# Patient Record
Sex: Male | Born: 1935 | Race: White | Hispanic: No | Marital: Married
Health system: Western US, Academic
[De-identification: ages and names within clinical notes are randomized; demographics above are authoritative.]

## PROBLEM LIST (undated history)

## (undated) DIAGNOSIS — I639 Cerebral infarction, unspecified: Secondary | ICD-10-CM

## (undated) DIAGNOSIS — I712 Thoracic aortic aneurysm, without rupture: Secondary | ICD-10-CM

## (undated) DIAGNOSIS — C61 Malignant neoplasm of prostate: Secondary | ICD-10-CM

## (undated) DIAGNOSIS — I2584 Coronary atherosclerosis due to calcified coronary lesion: Secondary | ICD-10-CM

## (undated) DIAGNOSIS — R55 Syncope and collapse: Secondary | ICD-10-CM

## (undated) DIAGNOSIS — C4490 Unspecified malignant neoplasm of skin, unspecified: Secondary | ICD-10-CM

## (undated) DIAGNOSIS — E785 Hyperlipidemia, unspecified: Secondary | ICD-10-CM

## (undated) DIAGNOSIS — I2510 Atherosclerotic heart disease of native coronary artery without angina pectoris: Secondary | ICD-10-CM

## (undated) DIAGNOSIS — I10 Essential (primary) hypertension: Secondary | ICD-10-CM

## (undated) HISTORY — DX: Cerebral infarction, unspecified: I63.9

## (undated) HISTORY — DX: Malignant neoplasm of prostate: C61

## (undated) HISTORY — DX: Syncope and collapse: R55

## (undated) HISTORY — DX: Unspecified malignant neoplasm of skin, unspecified: C44.90

## (undated) HISTORY — DX: Atherosclerotic heart disease of native coronary artery without angina pectoris: I25.10

## (undated) HISTORY — DX: Hyperlipidemia, unspecified: E78.5

## (undated) HISTORY — DX: Coronary atherosclerosis due to calcified coronary lesion: I25.84

## (undated) HISTORY — DX: Thoracic aortic aneurysm, without rupture: I71.2

## (undated) HISTORY — DX: Essential (primary) hypertension: I10

---

## 1988-11-09 HISTORY — PX: KIDNEY STONE SURGERY: SHX686

## 1992-11-09 HISTORY — PX: MELANOMA EXCISION: SHX5266

## 2003-11-10 HISTORY — PX: HERNIA REPAIR: SHX51

## 2015-08-17 ENCOUNTER — Emergency Department (EMERGENCY_DEPARTMENT_HOSPITAL): Payer: Medicare Other

## 2015-08-17 ENCOUNTER — Inpatient Hospital Stay
Admission: EM | Admit: 2015-08-17 | Discharge: 2015-08-18 | DRG: 312 | Disposition: A | Discharge status: Home / Self Care | Payer: Medicare Other | Attending: Specialist | Admitting: Specialist

## 2015-08-17 DIAGNOSIS — I10 Essential (primary) hypertension: Secondary | ICD-10-CM | POA: Diagnosis present

## 2015-08-17 DIAGNOSIS — R079 Chest pain, unspecified: Secondary | ICD-10-CM

## 2015-08-17 DIAGNOSIS — N170 Acute kidney failure with tubular necrosis: Secondary | ICD-10-CM | POA: Diagnosis present

## 2015-08-17 DIAGNOSIS — R55 Syncope and collapse: Principal | ICD-10-CM | POA: Diagnosis present

## 2015-08-17 DIAGNOSIS — R011 Cardiac murmur, unspecified: Secondary | ICD-10-CM

## 2015-08-17 DIAGNOSIS — Z87891 Personal history of nicotine dependence: Secondary | ICD-10-CM | POA: Insufficient documentation

## 2015-08-17 DIAGNOSIS — Z8546 Personal history of malignant neoplasm of prostate: Secondary | ICD-10-CM | POA: Insufficient documentation

## 2015-08-17 DIAGNOSIS — Z923 Personal history of irradiation: Secondary | ICD-10-CM | POA: Insufficient documentation

## 2015-08-17 DIAGNOSIS — Z8673 Personal history of transient ischemic attack (TIA), and cerebral infarction without residual deficits: Secondary | ICD-10-CM | POA: Insufficient documentation

## 2015-08-17 LAB — CBC WITH DIFFERENTIAL
BASOPHILS % AUTO: 0.3 %
BASOPHILS ABS AUTO: 0 10*3/uL (ref 0–0.2)
EOSINOPHIL % AUTO: 1 %
EOSINOPHIL ABS AUTO: 0.1 10*3/uL (ref 0–0.5)
HEMATOCRIT: 37.9 % — AB (ref 41–53)
HEMOGLOBIN: 12.4 g/dL — AB (ref 13.5–17.5)
LYMPHOCYTE ABS AUTO: 1.4 10*3/uL (ref 1.0–4.8)
LYMPHOCYTES % AUTO: 19.7 %
MCH: 28.5 pg (ref 27–33)
MCHC: 32.6 % (ref 32–36)
MCV: 87.4 UM3 (ref 80–100)
MONOCYTES % AUTO: 6.3 %
MONOCYTES ABS AUTO: 0.4 10*3/uL (ref 0.1–0.8)
MPV: 7.3 UM3 (ref 6.8–10.0)
NEUTROPHIL ABS AUTO: 5.1 10*3/uL (ref 1.80–7.70)
NEUTROPHILS % AUTO: 72.7 %
PLATELET COUNT: 179 10*3/uL (ref 130–400)
RDW: 14.8 U — AB (ref 0–14.7)
RED CELL COUNT: 4.34 10*6/uL — AB (ref 4.5–5.9)
WHITE BLOOD CELL COUNT: 7 10*3/uL (ref 4.5–11.0)

## 2015-08-17 LAB — TROPONIN I

## 2015-08-17 LAB — BASIC METABOLIC PANEL
CALCIUM: 9 mg/dL (ref 8.6–10.5)
CARBON DIOXIDE TOTAL: 23 meq/L — AB (ref 24–32)
CHLORIDE: 103 meq/L (ref 95–110)
CREATININE BLOOD: 1.44 mg/dL — AB (ref 0.44–1.27)
GLUCOSE: 101 mg/dL — AB (ref 70–99)
POTASSIUM: 4.2 meq/L (ref 3.3–5.0)
SODIUM: 136 meq/L (ref 135–145)
UREA NITROGEN, BLOOD (BUN): 26 mg/dL — AB (ref 8–22)
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
## 2015-08-17 NOTE — ED Triage Note (Signed)
Felt dizziness with cold sweats and nausea prior to syncope. Hx cva, no facial droop, speech clear, cms intact, grips strong and equal. Denies pain

## 2015-08-18 ENCOUNTER — Inpatient Hospital Stay (EMERGENCY_DEPARTMENT_HOSPITAL): Payer: Medicare Other

## 2015-08-18 DIAGNOSIS — I77810 Thoracic aortic ectasia: Secondary | ICD-10-CM

## 2015-08-18 DIAGNOSIS — I700 Atherosclerosis of aorta: Secondary | ICD-10-CM

## 2015-08-18 DIAGNOSIS — R55 Syncope and collapse: Secondary | ICD-10-CM | POA: Insufficient documentation

## 2015-08-18 DIAGNOSIS — I2510 Atherosclerotic heart disease of native coronary artery without angina pectoris: Secondary | ICD-10-CM

## 2015-08-18 DIAGNOSIS — I10 Essential (primary) hypertension: Secondary | ICD-10-CM

## 2015-08-18 DIAGNOSIS — Z8673 Personal history of transient ischemic attack (TIA), and cerebral infarction without residual deficits: Secondary | ICD-10-CM

## 2015-08-18 DIAGNOSIS — E041 Nontoxic single thyroid nodule: Secondary | ICD-10-CM

## 2015-08-18 LAB — MAGNESIUM (MG): MAGNESIUM (MG): 2 mg/dL (ref 1.5–2.6)

## 2015-08-18 LAB — BASIC METABOLIC PANEL
CALCIUM: 9.1 mg/dL (ref 8.6–10.5)
CALCIUM: 9 mg/dL (ref 8.6–10.5)
CALCIUM: 8.8 mg/dL (ref 8.6–10.5)
CARBON DIOXIDE TOTAL: 25 meq/L (ref 24–32)
CARBON DIOXIDE TOTAL: 25 meq/L (ref 24–32)
CARBON DIOXIDE TOTAL: 24 meq/L (ref 24–32)
CHLORIDE: 107 meq/L (ref 95–110)
CHLORIDE: 105 meq/L (ref 95–110)
CHLORIDE: 104 meq/L (ref 95–110)
CREATININE BLOOD: 1.29 mg/dL — AB (ref 0.44–1.27)
CREATININE BLOOD: 1.16 mg/dL (ref 0.44–1.27)
CREATININE BLOOD: 1.03 mg/dL (ref 0.44–1.27)
GLUCOSE: 111 mg/dL — AB (ref 70–99)
GLUCOSE: 180 mg/dL — AB (ref 70–99)
GLUCOSE: 153 mg/dL — AB (ref 70–99)
POTASSIUM: 4 meq/L (ref 3.3–5.0)
POTASSIUM: 4.3 meq/L (ref 3.3–5.0)
POTASSIUM: 4.1 meq/L (ref 3.3–5.0)
SODIUM: 140 meq/L (ref 135–145)
SODIUM: 139 meq/L (ref 135–145)
SODIUM: 138 meq/L (ref 135–145)
UREA NITROGEN, BLOOD (BUN): 21 mg/dL (ref 8–22)
UREA NITROGEN, BLOOD (BUN): 20 mg/dL (ref 8–22)
UREA NITROGEN, BLOOD (BUN): 18 mg/dL (ref 8–22)

## 2015-08-18 LAB — CBC WITH DIFFERENTIAL
BASOPHILS % AUTO: 0.3 %
BASOPHILS ABS AUTO: 0 10*3/uL (ref 0–0.2)
EOSINOPHIL % AUTO: 1.5 %
EOSINOPHIL ABS AUTO: 0.1 10*3/uL (ref 0–0.5)
HEMATOCRIT: 38 % — AB (ref 41–53)
HEMOGLOBIN: 12.7 g/dL — AB (ref 13.5–17.5)
LYMPHOCYTE ABS AUTO: 1.4 10*3/uL (ref 1.0–4.8)
LYMPHOCYTES % AUTO: 23.3 %
MCH: 29 pg (ref 27–33)
MCHC: 33.5 % (ref 32–36)
MCV: 86.7 UM3 (ref 80–100)
MONOCYTES % AUTO: 7.6 %
MONOCYTES ABS AUTO: 0.5 10*3/uL (ref 0.1–0.8)
MPV: 7.2 UM3 (ref 6.8–10.0)
NEUTROPHIL ABS AUTO: 4.1 10*3/uL (ref 1.80–7.70)
NEUTROPHILS % AUTO: 67.3 %
PLATELET COUNT: 170 10*3/uL (ref 130–400)
RDW: 14.6 U (ref 0–14.7)
RED CELL COUNT: 4.38 10*6/uL — AB (ref 4.5–5.9)
WHITE BLOOD CELL COUNT: 6.1 10*3/uL (ref 4.5–11.0)

## 2015-08-18 LAB — D-DIMER: D-DIMER: 1670 ng/mL — AB (ref 0–230)

## 2015-08-18 MED ORDER — HEPARIN, PORCINE (PF) 5,000 UNIT/0.5 ML INJECTION SYRINGE
5000.0000 [IU] | INJECTION | Freq: Three times a day (TID) | INTRAMUSCULAR | Status: DC
Start: 2015-08-18 — End: 2015-08-18
  Administered 2015-08-18 (×2): 5000 [IU] via SUBCUTANEOUS
  Filled 2015-08-18 (×2): qty 1

## 2015-08-18 MED ORDER — NACL 0.9% IV BOLUS - DURATION REQ
1000.0000 mL | Freq: Once | INTRAVENOUS | Status: AC
Start: 2015-08-18 — End: 2015-08-18
  Administered 2015-08-18: 1000 mL via INTRAVENOUS

## 2015-08-18 MED ORDER — NACL 0.9% IV INFUSION
INTRAVENOUS | Status: DC
Start: 2015-08-18 — End: 2015-08-18
  Administered 2015-08-18: 10:00:00 via INTRAVENOUS

## 2015-08-18 MED ORDER — ASPIRIN 81 MG CHEWABLE TABLET
81.0000 mg | CHEWABLE_TABLET | Freq: Every day | ORAL | Status: DC
Start: 2015-08-18 — End: 2015-08-18
  Administered 2015-08-18: 81 mg via ORAL
  Filled 2015-08-18: qty 1

## 2015-08-18 NOTE — ED Nursing Note (Signed)
Report back to [employer] given

## 2015-08-18 NOTE — ED Nursing Note (Signed)
Assumed care for 1 hr from [facility] for lunch relief

## 2015-08-18 NOTE — ED Nursing Note (Signed)
Assumed care for 1 hour lunch relief.

## 2015-08-18 NOTE — ED Nursing Note (Signed)
MD aware of elevated d-dimer, no further orders. Patient remains without complaint, no chest pain, no shortness of breath, no leg pain or swelling.

## 2015-08-18 NOTE — ED Nursing Note (Signed)
Assumed care of patient. Resting comfortably in bed, no distress noted. Pt denies any chest pain or dizziness. Will continue to monitor.

## 2015-08-18 NOTE — ED Nursing Note (Signed)
Labs collected and sent. Pt started in IV fluids per order. Pt taken to get vascular ultrasound. Will continue to monitor.

## 2015-08-18 NOTE — Discharge Instructions (Signed)
Follow-up with your primary care provider within 1-2 weeks (as soon as you return to [facility]) for assessment of the finding of dilation of the ascending aorta as seen on CT scan performed during this admission.  Images and full reports can be obtained by your provider's office upon request.

## 2015-08-18 NOTE — ED Nursing Note (Signed)
Pt eating lunch. Resting comfortably.

## 2015-08-18 NOTE — ED Nursing Note (Signed)
Patient sleeping even unlabored resp

## 2015-08-18 NOTE — ED Nursing Note (Signed)
Reported off to Mark RN

## 2015-08-18 NOTE — Progress Notes (Addendum)
HOSPITAL MEDICINE ADMISSION FOLLOW-UP NOTE    Joel Love is a 81yr old male with a history of prostate cancer and prior CVA admitted for syncope evaluation2.  Patient admitted this morning, please see H&P by Dr. Trisha Mangle for full details of this admission.    Per history, the patient suffered a fainting episode while flying to [facility] from the [employer].  He denies any residual symptoms at this time and wants to be discharged by the end of the day.  ECHO is pending and d-dimer is elevated, which is nonspecific but opens the door for evaluation for PE as cause for syncope.  Patient willing to undergo a limited evaluation that could be done by this afternoon. Er radiology, questionable aortic dilation also noted on chest x-ray, can be followed by non-contrast chest CT.  Plan:     ECHO   LE dopplers in response to d-dimer   Non-contrast chest CT for aorta eval   Monitor on telemetry   Fluids, recheck BMP at 3 PM    If no acute findings on this limited evaluation (CT w/ contrast and overnight flouids / repeat renal eval in the morning discussed but declined by the patient) will likely proceed with d/c to home this afternoon    Addendum:  (10:48 AM) Notified by VL of superficial thrombosis in the LE but no DVT 9likely to explain the patient's d-dimer levels).     Addendum: (1:40 PM) CT chest prelim result reviewed.  4.2 cm dilation of the ascending aorta identified.  Will request CT surgery assessment and follow-up recommendations.      Addendum: (2:15 PM)Discussed with CT surgery team (who reviewed case with their attending).  Dilation above 4.5 cm is considered indication for elective surgery and no urgent intervention is required based on the patient's overall clinical presentation.  TTE to rule out severe aortic valve insufficiency (no murmur on my exam) is still recommended and will be done shortly.  If this is not seen on TTE, will proceed with discharge.     Addendum:  Overall plan of care discussed with the  patient, his wife and daughter at the bedside.  Patient does not want to remain for ECHO unless there is very high concern for aortic valve disorder, which is not supported by history (no prior symptoms previous to the single event on the the plane) and exam (no murmur).  Creatinine further improved to 1.03.  Patient discharged per his request, instructed to follow-up with his PMD immediately after discharge.      Report Electronically Signed by:   Arley Phenix. Webb Silversmith, MD/PhD  [facility] Attending  PI# [phone]
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
## 2015-08-18 NOTE — ED Nursing Note (Signed)
Pt discharged to home under care of family via steady ambulation to lobby. IV removed, tip intact. D/c instructions provided by MD. Pt verbalized understanding, denies further questions. Dr. Jerald Kief had conversation regarding ECHO with pt with pt ultimately deciding to forgo echo. VSS to baseline. Pt verifies all pt's belongings have been located and taken out of room. Pt states he is comfortable with plan for discharge and follow up and is eager to go home.

## 2015-08-18 NOTE — ED Nursing Note (Signed)
Patient without complaint and expressing a desire to go home this am.

## 2015-08-19 LAB — ELECTROCARDIOGRAM WITH RHYTHM STRIP: QTC: 436

## 2015-09-19 ENCOUNTER — Encounter: Payer: Self-pay | Admitting: Cardiovascular Disease

## 2015-09-19 ENCOUNTER — Ambulatory Visit (INDEPENDENT_AMBULATORY_CARE_PROVIDER_SITE_OTHER): Payer: Medicare Other | Admitting: Cardiovascular Disease

## 2015-09-19 VITALS — BP 162/100 | HR 76 | Ht 69.0 in | Wt 178.3 lb

## 2015-09-19 DIAGNOSIS — I639 Cerebral infarction, unspecified: Secondary | ICD-10-CM

## 2015-09-19 DIAGNOSIS — I63019 Cerebral infarction due to thrombosis of unspecified vertebral artery: Secondary | ICD-10-CM

## 2015-09-19 DIAGNOSIS — I2510 Atherosclerotic heart disease of native coronary artery without angina pectoris: Secondary | ICD-10-CM

## 2015-09-19 DIAGNOSIS — Z13220 Encounter for screening for lipoid disorders: Secondary | ICD-10-CM

## 2015-09-19 DIAGNOSIS — I712 Thoracic aortic aneurysm, without rupture, unspecified: Secondary | ICD-10-CM | POA: Insufficient documentation

## 2015-09-19 DIAGNOSIS — I2584 Coronary atherosclerosis due to calcified coronary lesion: Secondary | ICD-10-CM

## 2015-09-19 DIAGNOSIS — Z9889 Other specified postprocedural states: Secondary | ICD-10-CM

## 2015-09-19 DIAGNOSIS — R072 Precordial pain: Secondary | ICD-10-CM

## 2015-09-19 DIAGNOSIS — R55 Syncope and collapse: Secondary | ICD-10-CM

## 2015-09-19 DIAGNOSIS — Z8679 Personal history of other diseases of the circulatory system: Secondary | ICD-10-CM

## 2015-09-19 DIAGNOSIS — I10 Essential (primary) hypertension: Secondary | ICD-10-CM

## 2015-09-19 HISTORY — DX: Thoracic aortic aneurysm, without rupture: I71.2

## 2015-09-19 HISTORY — DX: Atherosclerotic heart disease of native coronary artery without angina pectoris: I25.10

## 2015-09-19 HISTORY — DX: Cerebral infarction, unspecified: I63.9

## 2015-09-19 HISTORY — DX: Essential (primary) hypertension: I10

## 2015-09-19 HISTORY — DX: Thoracic aortic aneurysm, without rupture, unspecified: I71.20

## 2015-09-19 HISTORY — DX: Syncope and collapse: R55

## 2015-09-19 MED ORDER — LOSARTAN POTASSIUM 50 MG PO TABS: 50.0000 mg | ORAL_TABLET | Freq: Every day | ORAL | Status: DC

## 2015-09-19 NOTE — Patient Instructions (Addendum)
Your physician wants you to follow-up in [place]. You will receive a reminder letter in the mail two months in advance. If you don't receive a letter, please call our office to schedule the follow-up appointment.    Your physician has requested that you have an echocardiogram [place]  300. Echocardiography is a painless test that uses sound waves to create images of your heart. It provides your doctor with information about the size and shape of your heart and how well your heart's chambers and valves are working. This procedure takes approximately one hour. There are no restrictions for this procedure.    Your physician has requested that you have en exercise stress myoview. For further information please visit [email or web address]. Please follow instruction sheet, as given.  START LOSARTAN 50 MG ONE TABLET BY MOUTH DAILY.  LABS- CMP,LIPID-- NOTHING TO EAT OR DRINK THE MORNING OF THE TEST  If you need a refill on your cardiac medications before your next appointment, please call your pharmacy.

## 2015-09-19 NOTE — Progress Notes (Signed)
Cardiology Office Note   Date:  09/19/2015   ID:  Chris Navarro, DOB 1935-03-20, MRN [id number]  PCP:  Marcelina Morel, MD  Cardiologist:   Sharol Harness, MD   Chief Complaint  Patient presents with  . New Evaluation    Holter Monitor 10/27-10/28//pt c/o slight lightheadedness when he bends down and stands up//pt states no other Sx.      History of Present Illness: Kyndal Wiggs is a 80 y.o. male with prior stroke who presents for management of a thoracic aortic aneurysm. Mr. next was on a transcontinental flight in October when he had an episode of syncope. He suddenly broke out into a sweat and then passed out. He denies any preceding chest pain, palpitations, or shortness of breath. The episode lasted for 1-2 minutes before he regained consciousness. He denies any loss of bowel or bladder function or seizure-like activity. He was seen at [facility] where his workup was reportedly unremarkable.. The event was thought to be due to intravascular volume depletion. He had a CT scan that was negative for pulmonary embolism, but did show some coronary calcifications. It also showed an ascending thoracic aortic aneurysm of 4.3 cm. He was told to follow-up with cardiologist.  Mr. Salama also had a Holter monitor placed that revealed mostly sinus rhythm with 34 minutes of bradycardia with heart rates in the upper 40s. In addition it revealed a 16 beat run of supraventricular tachycardia.  He denies any symptoms while wearing the monitor and thinks that he was up and getting dressed during the episode of bradycardia. He does not refer any lightheadedness or dizziness at that time.  Mr.Wolak denies any chest pain, shortness of breath, lower extremity edema, orthopnea, PND. He golfs 2-3 times per week and mostly rides a cart but does walk as well. He does stretching exercises 5 times per week and occasionally does yard work or goes for a walk with his wife. He reports that his diet is pretty good.  He does like to eat a lot of cheese. He quit smoking over 50 years ago.  He and his wife spend November through April in [place], and are preparing to leave soon.  Past Medical History  Diagnosis Date  . Stroke [facility]) mini stroke  . Skin cancer melanoma  . Prostate cancer ([place])   . Thoracic aortic aneurysm ([place]) 09/19/2015    Descending; 4.3 cm 08/2015 on CT scan  . Essential hypertension 09/19/2015  . Stroke (cerebrum) ([place]) 09/19/2015  . Syncope 09/19/2015  . Coronary artery calcification 09/19/2015    Past Surgical History  Procedure Laterality Date  . Kidney stone surgery  1990  . Hernia repair  2005  . Melanoma excision  1994     Current Outpatient Prescriptions  Medication Sig Dispense Refill  . aspirin 81 MG tablet Take 81 mg by mouth daily.    . Magnesium 400 MG CAPS Take 400 mg by mouth daily.    Marland Kitchen losartan (COZAAR) 50 MG tablet Take 1 tablet (50 mg total) by mouth daily. 90 tablet 3   No current facility-administered medications for this visit.    Allergies:   Review of patient's allergies indicates no known allergies.    Social History:  The patient  reports that he quit smoking about 50 years ago. He does not have any smokeless tobacco history on file. He reports that he drinks about 0.6 - 1.2 oz of alcohol per week.   Family History:  The patient's family  history includes Angina in his brother; Cancer in his sister; Diabetes in his brother; Stroke in his father.    ROS:  Please see the history of present illness.   Otherwise, review of systems are positive for none.   All other systems are reviewed and negative.    PHYSICAL EXAM: VS:  BP 162/100 mmHg  Pulse 76  Ht 5\' 9"  (1.753 m)  Wt 80.876 kg (178 lb 4.8 oz)  BMI 26.32 kg/m2 , BMI Body mass index is 26.32 kg/(m^2). GENERAL:  Well appearing HEENT:  Pupils equal round and reactive, fundi not visualized, oral mucosa unremarkable NECK:  No jugular venous distention, waveform within normal limits, carotid  upstroke brisk and symmetric, no bruits, no thyromegaly LYMPHATICS:  No cervical adenopathy LUNGS:  Clear to auscultation bilaterally HEART:  RRR.  PMI not displaced or sustained,S1 and S2 within normal limits, no S3, no S4, no clicks, no rubs, no murmurs ABD:  Flat, positive bowel sounds normal in frequency in pitch, no bruits, no rebound, no guarding, no midline pulsatile mass, no hepatomegaly, no splenomegaly EXT:  2 plus pulses throughout, no edema, no cyanosis no clubbing SKIN:  No rashes no nodules NEURO:  Cranial nerves II through XII grossly intact, motor grossly intact throughout PSYCH:  Cognitively intact, oriented to person place and time    EKG:  EKG is ordered today. The ekg ordered today demonstrates sinus rhythm rate 76 bpm.   Recent Labs: No results found for requested labs within last 365 days.    Lipid Panel No results found for: CHOL, TRIG, HDL, CHOLHDL, VLDL, LDLCALC, LDLDIRECT    Wt Readings from Last 3 Encounters:  09/19/15 80.876 kg (178 lb 4.8 oz)      ASSESSMENT AND PLAN:  # Coronary calcification: Given the finding of coronary calcification, we know that Mr. Bayron has some coronary artery disease. Therefore we should maximize his medications. We will obtain a fasting lipid panel and determine his statin dose based on those results. We will also obtain a comprehensive metabolic panel to assess his liver function prior to starting therapy. He is already taking an aspirin. Given that he had this episode of syncope, we will also refer him for exercise Myoview to evaluate for obstructive coronary artery disease.  # Syncope: It is unclear what caused Mr. Presto to have a syncopal event.  He did have documented bradycardia on his Holter. However he was a symptomatically at the time. Therefore we will not refer him for placement of a pacemaker. We will, however, avoid nodal agents. We are also evaluating for coronary artery disease as above.  # Hypertension: Blood  pressure poorly controlled. We discussed the fact that this will be important in managing his aortic aneurysm. We will start losartan 50 mg daily. We are obtaining a compressive metabolic panel to assess his renal action and electrolytes. Unfortunately, we will not be able to start a beta blocker due to bradycardia noted on his Holter monitor and a syncopal event.  # TAA: 4.3 cm. Repeat this in one year and spread out the interval for monitoring if he remains stable at that time. Hypertension management as above.  We will obtain an echo to ensure that he does not have a bicuspid aortic valve.  I spent 45 minutes with Mr. Windon and his wife.  Current medicines are reviewed at length with the patient today.  The patient does not have concerns regarding medicines.  The following changes have been made:  Start losartan  Labs/ tests ordered today include:   Orders Placed This Encounter  Procedures  . Lipid panel  . Comprehensive metabolic panel  . Myocardial Perfusion Imaging  . ECHOCARDIOGRAM COMPLETE     Disposition:   FU with Ethleen Lormand C. Oval Linsey, MD in 6 months.    Signed, Sharol Harness, MD  09/19/2015 5:26 PM    [place]

## 2015-09-20 ENCOUNTER — Ambulatory Visit (HOSPITAL_COMMUNITY)
Admission: RE | Admit: 2015-09-20 | Discharge: 2015-09-20 | Disposition: A | Discharge status: Home / Self Care | Payer: Medicare Other | Source: Ambulatory Visit | Attending: Cardiology | Admitting: Cardiology

## 2015-09-20 ENCOUNTER — Telehealth (HOSPITAL_COMMUNITY): Payer: Self-pay

## 2015-09-20 ENCOUNTER — Encounter: Payer: Self-pay | Admitting: Cardiovascular Disease

## 2015-09-20 DIAGNOSIS — Z13220 Encounter for screening for lipoid disorders: Secondary | ICD-10-CM | POA: Diagnosis not present

## 2015-09-20 DIAGNOSIS — I10 Essential (primary) hypertension: Secondary | ICD-10-CM | POA: Diagnosis not present

## 2015-09-20 DIAGNOSIS — I639 Cerebral infarction, unspecified: Secondary | ICD-10-CM | POA: Diagnosis not present

## 2015-09-20 DIAGNOSIS — I2510 Atherosclerotic heart disease of native coronary artery without angina pectoris: Secondary | ICD-10-CM

## 2015-09-20 DIAGNOSIS — R072 Precordial pain: Secondary | ICD-10-CM

## 2015-09-20 DIAGNOSIS — R42 Dizziness and giddiness: Secondary | ICD-10-CM | POA: Insufficient documentation

## 2015-09-20 DIAGNOSIS — Z8673 Personal history of transient ischemic attack (TIA), and cerebral infarction without residual deficits: Secondary | ICD-10-CM | POA: Insufficient documentation

## 2015-09-20 DIAGNOSIS — I2584 Coronary atherosclerosis due to calcified coronary lesion: Secondary | ICD-10-CM

## 2015-09-20 LAB — COMPREHENSIVE METABOLIC PANEL
ALBUMIN: 4 g/dL (ref 3.6–5.1)
ALK PHOS: 71 U/L (ref 40–115)
ALT: 10 U/L (ref 9–46)
AST: 14 U/L (ref 10–35)
BUN: 20 mg/dL (ref 7–25)
CO2: 27 mmol/L (ref 20–31)
CREATININE: 1.26 mg/dL — AB (ref 0.70–1.18)
Calcium: 9.3 mg/dL (ref 8.6–10.3)
Chloride: 111 mmol/L — ABNORMAL HIGH (ref 98–110)
Glucose, Bld: 85 mg/dL (ref 65–99)
Potassium: 5.2 mmol/L (ref 3.5–5.3)
SODIUM: 147 mmol/L — AB (ref 135–146)
TOTAL PROTEIN: 6.9 g/dL (ref 6.1–8.1)
Total Bilirubin: 0.5 mg/dL (ref 0.2–1.2)

## 2015-09-20 LAB — MYOCARDIAL PERFUSION IMAGING
CHL CUP NUCLEAR SDS: 0
CHL CUP NUCLEAR SRS: 2
CHL CUP RESTING HR STRESS: 60 {beats}/min
CHL CUP STRESS STAGE 1 HR: 82 {beats}/min
CHL CUP STRESS STAGE 2 SPEED: 1 mph
CHL CUP STRESS STAGE 3 GRADE: 0 %
CHL CUP STRESS STAGE 3 SPEED: 1 mph
CHL CUP STRESS STAGE 4 GRADE: 10 %
CHL CUP STRESS STAGE 4 HR: 120 {beats}/min
CHL CUP STRESS STAGE 4 SPEED: 1.7 mph
CHL CUP STRESS STAGE 5 DBP: 89 mmHg
CHL CUP STRESS STAGE 5 HR: 144 {beats}/min
CHL CUP STRESS STAGE 5 SBP: 161 mmHg
CHL CUP STRESS STAGE 5 SPEED: 2.5 mph
CHL CUP STRESS STAGE 6 GRADE: 12.1 %
CHL CUP STRESS STAGE 6 HR: 144 {beats}/min
CHL CUP STRESS STAGE 7 HR: 129 {beats}/min
CHL CUP STRESS STAGE 8 GRADE: 0 %
CHL CUP STRESS STAGE 8 HR: 75 {beats}/min
CHL CUP STRESS STAGE 8 SPEED: 0 mph
CSEPED: 6 min
CSEPEW: 7 METS
CSEPPHR: 144 {beats}/min
CSEPPMHR: 100 %
LV dias vol: 137 mL
LV sys vol: 72 mL
MPHR: 143 {beats}/min
Percent HR: 100 %
RPE: 16
SSS: 2
Stage 1 DBP: 95 mmHg
Stage 1 Grade: 0 %
Stage 1 SBP: 175 mmHg
Stage 1 Speed: 0 mph
Stage 2 Grade: 0 %
Stage 2 HR: 81 {beats}/min
Stage 3 HR: 81 {beats}/min
Stage 4 DBP: 96 mmHg
Stage 4 SBP: 134 mmHg
Stage 5 Grade: 12 %
Stage 6 Speed: 2.5 mph
Stage 7 DBP: 82 mmHg
Stage 7 Grade: 0 %
Stage 7 SBP: 196 mmHg
Stage 7 Speed: 0 mph
Stage 8 DBP: 102 mmHg
Stage 8 SBP: 193 mmHg
TID: 1.07

## 2015-09-20 LAB — LIPID PANEL
Cholesterol: 222 mg/dL — ABNORMAL HIGH (ref 125–200)
HDL: 53 mg/dL (ref >=40)
LDL CALC: 143 mg/dL — AB (ref <130)
TRIGLYCERIDES: 131 mg/dL (ref <150)
Total CHOL/HDL Ratio: 4.2 Ratio (ref <=5.0)
VLDL: 26 mg/dL (ref <30)

## 2015-09-20 MED ORDER — TECHNETIUM TC 99M SESTAMIBI GENERIC - CARDIOLITE
31.8000 | Freq: Once | INTRAVENOUS | Status: AC | PRN
  Administered 2015-09-20: 31.8 via INTRAVENOUS

## 2015-09-20 MED ORDER — TECHNETIUM TC 99M SESTAMIBI GENERIC - CARDIOLITE
10.7000 | Freq: Once | INTRAVENOUS | Status: AC | PRN
  Administered 2015-09-20: 10.7 via INTRAVENOUS

## 2015-09-20 NOTE — Telephone Encounter (Signed)
Encounter complete. 

## 2015-09-24 ENCOUNTER — Telehealth (HOSPITAL_COMMUNITY): Payer: Self-pay | Admitting: *Deleted

## 2015-09-24 ENCOUNTER — Encounter: Payer: Self-pay | Admitting: *Deleted

## 2015-09-24 ENCOUNTER — Telehealth: Payer: Self-pay | Admitting: *Deleted

## 2015-09-24 ENCOUNTER — Other Ambulatory Visit: Payer: Self-pay | Admitting: *Deleted

## 2015-09-24 NOTE — Telephone Encounter (Signed)
-----   Message from Skeet Latch, MD sent at 09/20/2015  5:53 PM EST ----- Normal coronary artery blood flow, but it looks like the heart is not squeezing normally.  We will wait for the results of his echo to learn more.

## 2015-09-24 NOTE — Telephone Encounter (Signed)
Spoke to patient. Result given . Verbalized understanding  

## 2015-09-25 ENCOUNTER — Telehealth: Payer: Self-pay | Admitting: Cardiovascular Disease

## 2015-09-25 DIAGNOSIS — E785 Hyperlipidemia, unspecified: Secondary | ICD-10-CM

## 2015-09-25 DIAGNOSIS — Z79899 Other long term (current) drug therapy: Secondary | ICD-10-CM

## 2015-09-25 MED ORDER — ATORVASTATIN CALCIUM 80 MG PO TABS: 80.0000 mg | ORAL_TABLET | Freq: Every day | ORAL | Status: DC

## 2015-09-25 NOTE — Telephone Encounter (Signed)
Spoke to patient 's wife. Results given. E-sent new prescription #90 x 3 atorvastatin 80 mg.  Mailed hepatic level -labslip To new address [place] going to [place]----[address]

## 2015-09-25 NOTE — Telephone Encounter (Signed)
-----   Message from Skeet Latch, MD sent at 09/25/2015  9:10 AM EST ----- Cholesterol levels are high and his risk of heart attack and stroke is high.  Start atorvastatin 80 mg daily to reduce that risk.  Follow up lfts in 1 month.  This can be done in [place].

## 2015-09-25 NOTE — Telephone Encounter (Signed)
Pt is calling in wanting to the know the results to the blood test he had done and he also inquired if an additional medication was going to be called in for him. Please f/u with him  Thanks

## 2015-10-02 NOTE — Addendum Note (Signed)
Addended by: Janett Labella A on: 10/02/2015 08:30 AM   Modules accepted: Orders

## 2015-10-07 ENCOUNTER — Telehealth: Payer: Self-pay | Admitting: Cardiovascular Disease

## 2015-10-07 NOTE — Telephone Encounter (Signed)
Echo order sent to provided fax. Informed caller via VM.

## 2015-10-07 NOTE — Telephone Encounter (Signed)
Pt's wife called in stating the hospital in [place] will need the orders for the Echo faxed to their facility. The name of the facility is [facility] in [place] and the fax number is [phone]. Please f/u with the wife once this has been sent   Thanks

## 2015-10-09 ENCOUNTER — Telehealth: Payer: Self-pay | Admitting: Cardiovascular Disease

## 2015-10-09 NOTE — Telephone Encounter (Signed)
She said she gave you an incorrect fax number the other day.The correct fax number is [phone].Would you please refax it.

## 2015-10-09 NOTE — Telephone Encounter (Signed)
Gave corrected fax number to  [facility]  In [place]  [phone] She has written order already to send Please let patient's wife know of completion

## 2015-10-29 ENCOUNTER — Telehealth: Payer: Self-pay | Admitting: Cardiovascular Disease

## 2015-10-29 NOTE — Telephone Encounter (Signed)
Echo report received.  10/23/15: LVEF 55-60%.  Mild LVH.  RV dilated with PASP 30 mmHg.  Mild MR, mild TR.  Aortic root 3.7 cm.  Aortic valve is normal.

## 2015-10-30 ENCOUNTER — Telehealth: Payer: Self-pay | Admitting: *Deleted

## 2015-10-30 NOTE — Telephone Encounter (Signed)
-----   Message from Skeet Latch, MD sent at 10/29/2015  4:52 PM EST ----- Echo shows normal aortic valve.  Mildly dilated aorta.  His heart does not relax completely.  It will be important keep his blood pressure well-controlled.  Keep follow up as scheduled.  Thank you for sending report.

## 2015-10-30 NOTE — Telephone Encounter (Signed)
LEFT MESSAGE TO CALL BACK CONCERNING LABS AND ECHO  LABS ( 10/26/15- hepatic panel ) REVIEWED -BY Dr- no change in current treatment  Oval Linsey

## 2015-10-30 NOTE — Telephone Encounter (Signed)
Spoke to [place] , okay to use ibuprofen - take with food, do not take everyday. Spoke to patient and wife. Information given. Verbalized understanding.

## 2015-10-30 NOTE — Telephone Encounter (Signed)
Spoke to patient. Result given . Verbalized understanding Patient wanted to know if he could use ibuprofen or anything else  RN will refer to pharmacist and contact patient

## 2015-11-22 ENCOUNTER — Encounter: Payer: Self-pay | Admitting: Cardiovascular Disease

## 2016-02-10 NOTE — Progress Notes (Signed)
Cardiology Office Note   Date:  02/11/2016   ID:  Chris Navarro, DOB 09-02-1935, MRN [id number]  PCP:  Chris Boone, MD  Cardiologist:   Chris Harness, MD   Chief Complaint  Patient presents with  . Hypertension    denies any chest pain, sob, le edema, or claudication      Patient ID: Chris Navarro is a 81 y.o. male with prior stroke, thoracic aortic aneurysm, and moderate aortic regurgitation who presents for management of a thoracic aortic aneurysm.  Interval History 02/11/16: After his last appointment Chris Navarro had an exercise Cardiolite that revealed an LVEF of 48% but there were no perfusion deficits.  He subsequently had an echo done in [place] with LVEF 55-60%.  There was moderate LA enlargement, mild mitral regurgitation, and moderate aortic regurgitation.   He has been doing fairly well.  He notes that his stamina is poor.  He gets tired after working in the yard for 15-30 minutes.  He notes general fatigue but denies any chest pain or shortness of breath.  His weight has been stable. He denies lower extremity edema, orthopnea, or PND. He also denies melena or hematochezia.  He occasionally notes lightheadedness when changing positions but denies any falls or syncope.  He brings a blood pressure log that shows his blood pressures have all been between [id number] to [id number] systolic.  Chris Navarro and his wife are back in [place] until November.  History of Present Illness 09/19/15:  Chris Navarro was on a transcontinental flight in October when he had an episode of syncope. He suddenly broke out into a sweat and then passed out. He denies any preceding chest pain, palpitations, or shortness of breath. The episode lasted for 1-2 minutes before he regained consciousness. He denies any loss of bowel or bladder function or seizure-like activity. He was seen at [facility] where his workup was reportedly unremarkable.. The event was thought to be due to intravascular volume depletion. He  had a CT scan that was negative for pulmonary embolism, but did show some coronary calcifications. It also showed an ascending thoracic aortic aneurysm of 4.3 cm. He was told to follow-up with cardiologist.  Chris Navarro also had a Holter monitor placed that revealed mostly sinus rhythm with 34 minutes of bradycardia with heart rates in the upper 40s. In addition it revealed a 16 beat run of supraventricular tachycardia.  He denies any symptoms while wearing the monitor and thinks that he was up and getting dressed during the episode of bradycardia. He does not refer any lightheadedness or dizziness at that time.  Chris Navarro denies any chest pain, shortness of breath, lower extremity edema, orthopnea, PND. He golfs 2-3 times per week and mostly rides a cart but does walk as well. He does stretching exercises 5 times per week and occasionally does yard work or goes for a walk with his wife. He reports that his diet is pretty good. He does like to eat a lot of cheese. He quit smoking over 50 years ago.  He and his wife spend November through April in [place], and are preparing to leave soon.  Past Medical History  Diagnosis Date  . Stroke [facility]) mini stroke  . Skin cancer melanoma  . Prostate cancer ([place])   . Thoracic aortic aneurysm ([place]) 09/19/2015    Descending; 4.3 cm 08/2015 on CT scan  . Essential hypertension 09/19/2015  . Stroke (cerebrum) ([place]) 09/19/2015  . Syncope 09/19/2015  . Coronary artery calcification  09/19/2015  . Hyperlipidemia 02/11/2016    Past Surgical History  Procedure Laterality Date  . Kidney stone surgery  1990  . Hernia repair  2005  . Melanoma excision  1994     Current Outpatient Prescriptions  Medication Sig Dispense Refill  . aspirin 81 MG tablet Take 81 mg by mouth daily.    Marland Kitchen atorvastatin (LIPITOR) 80 MG tablet Take 1 tablet (80 mg total) by mouth daily. 90 tablet 3  . losartan (COZAAR) 50 MG tablet Take 1 tablet (50 mg total) by mouth daily. 90 tablet 3  .  Magnesium 500 MG TABS Take 500 mg by mouth daily.     No current facility-administered medications for this visit.    Allergies:   Review of patient's allergies indicates no known allergies.    Social History:  The patient  reports that he quit smoking about 50 years ago. He does not have any smokeless tobacco history on file. He reports that he drinks about 0.6 - 1.2 oz of alcohol per week.   Family History:  The patient's family history includes Angina in his brother; Cancer in his sister; Diabetes in his brother; Stroke in his father.    ROS:  Please see the history of present illness.   Otherwise, review of systems are positive for numbness in the right leg and foot..   All other systems are reviewed and negative.    PHYSICAL EXAM: VS:  BP 130/74 mmHg  Pulse 64  Ht 5\' 9"  (1.753 m)  Wt 79.742 kg (175 lb 12.8 oz)  BMI 25.95 kg/m2 , BMI Body mass index is 25.95 kg/(m^2). GENERAL:  Well appearing HEENT:  Pupils equal round and reactive, fundi not visualized, oral mucosa unremarkable NECK:  No jugular venous distention, waveform within normal limits, carotid upstroke brisk and symmetric, no bruits LYMPHATICS:  No cervical adenopathy LUNGS:  Clear to auscultation bilaterally HEART:  RRR.  PMI not displaced or sustained,S1 and S2 within normal limits, no S3, no S4, no clicks, no rubs, no murmurs ABD:  Flat, positive bowel sounds normal in frequency in pitch, no bruits, no rebound, no guarding, no midline pulsatile mass, no hepatomegaly, no splenomegaly EXT:  2 plus pulses throughout, no edema, no cyanosis no clubbing SKIN:  No rashes no nodules NEURO:  Cranial nerves II through XII grossly intact, motor grossly intact throughout PSYCH:  Cognitively intact, oriented to person place and time  EKG:  EKG is ordered today. The ekg ordered today demonstrates sinus rhythm rate 76 bpm.  Recent Labs: 09/19/2015: ALT 10; BUN 20; Creat 1.26*; Potassium 5.2; Sodium 147*    Lipid Panel      Component Value Date/Time   CHOL 222* 09/19/2015 0841   TRIG 131 09/19/2015 0841   HDL 53 09/19/2015 0841   CHOLHDL 4.2 09/19/2015 0841   VLDL 26 09/19/2015 0841   LDLCALC 143* 09/19/2015 0841      Wt Readings from Last 3 Encounters:  02/11/16 79.742 kg (175 lb 12.8 oz)  09/20/15 80.74 kg (178 lb)  09/19/15 80.876 kg (178 lb 4.8 oz)      ASSESSMENT AND PLAN:   # Aortic regurgitation: Asymtomatic.  He is euvolemic on exam.  He had normal systolic function and no left ventricular dilation on echo. We will repeat his echo in 1 year.  # Asymptomatic coronary calcification: No perfusion deficits were noted on stress testing. Continue aspirin and atorvastatin 80 mg daily.  # Hypertension: Blood pressure is well-controlled on losartan.  #  Hyperlipidemia: Continue atorvastatin. Repeat lipids and LFTs in 1 year.  # TAA: 4.3 cm. Repeat this in one year and spread out the interval for monitoring if he remains stable at that time. Hypertension management as above.    # Fatigue: Chris Navarro notes fatigue with minimal exertion. He has no other associated symptoms. We will check a TSH and free T4 to assess his thyroid function. He is not anemic based on labs recently drawn.  We discussed his risks for sleep apnea and although he does endorse daytime somnolence, he feels well rested when awakening in the morning, does not snore heavily, or stop breathing while sleeping. Therefore we will not obtain a sleep study at this time.  Current medicines are reviewed at length with the patient today.  The patient does not have concerns regarding medicines.  The following changes have been made:   Labs/ tests ordered today include:   Orders Placed This Encounter  Procedures  . T4, free  . TSH  . ECHOCARDIOGRAM COMPLETE     Disposition:   FU with Jujhar Navarro C. Oval Linsey, MD in 1 year.    Signed, Chris Harness, MD  02/11/2016 1:41 PM    [place]

## 2016-02-11 ENCOUNTER — Encounter: Payer: Self-pay | Admitting: Cardiovascular Disease

## 2016-02-11 ENCOUNTER — Ambulatory Visit (INDEPENDENT_AMBULATORY_CARE_PROVIDER_SITE_OTHER): Payer: Medicare Other | Admitting: Cardiovascular Disease

## 2016-02-11 VITALS — BP 130/74 | HR 64 | Ht 69.0 in | Wt 175.8 lb

## 2016-02-11 DIAGNOSIS — I712 Thoracic aortic aneurysm, without rupture: Secondary | ICD-10-CM

## 2016-02-11 DIAGNOSIS — I10 Essential (primary) hypertension: Secondary | ICD-10-CM

## 2016-02-11 DIAGNOSIS — I351 Nonrheumatic aortic (valve) insufficiency: Secondary | ICD-10-CM | POA: Diagnosis not present

## 2016-02-11 DIAGNOSIS — E785 Hyperlipidemia, unspecified: Secondary | ICD-10-CM | POA: Diagnosis not present

## 2016-02-11 DIAGNOSIS — R5383 Other fatigue: Secondary | ICD-10-CM

## 2016-02-11 DIAGNOSIS — I7121 Aneurysm of the ascending aorta, without rupture: Secondary | ICD-10-CM

## 2016-02-11 HISTORY — DX: Hyperlipidemia, unspecified: E78.5

## 2016-02-11 LAB — TSH: TSH: 3.09 mIU/L (ref 0.40–4.50)

## 2016-02-11 LAB — T4, FREE: FREE T4: 1.3 ng/dL (ref 0.8–1.8)

## 2016-02-11 NOTE — Patient Instructions (Addendum)
Medication Instructions:  Your physician recommends that you continue on your current medications as directed. Please refer to the Current Medication list given to you today.   Labwork: Your physician recommends THAT YOU GET LABWORK AS SOON AS POSSIBLE. The lab can be found on the FIRST FLOOR of out building in [address]    Testing/Procedures: DR Hazard WILL WANT YOU TO HAVE A ECHO IN 1 YEAR TO BE SCHEDULED PRIOR TO YOUR 1 YEAR FOLLOW UP.  Your physician has requested that you have an echocardiogram IN 1 YEAR. Echocardiography is a painless test that uses sound waves to create images of your heart. It provides your doctor with information about the size and shape of your heart and how well your heart's chambers and valves are working. This procedure takes approximately one hour. There are no restrictions for this procedure.    Follow-Up: Your physician wants you to follow-up in: [place]. You will receive a reminder letter in the mail two months in advance. If you don't receive a letter, please call our office to schedule the follow-up appointment.   Any Other Special Instructions Will Be Listed Below (If Applicable).     If you need a refill on your cardiac medications before your next appointment, please call your pharmacy.

## 2016-06-11 ENCOUNTER — Telehealth: Payer: Self-pay | Admitting: Cardiovascular Disease

## 2016-06-11 MED ORDER — LOSARTAN POTASSIUM 50 MG PO TABS: 50.0000 mg | ORAL_TABLET | Freq: Every day | ORAL | 2 refills | Status: DC

## 2016-06-11 NOTE — Telephone Encounter (Signed)
New message     *STAT* If patient is at the pharmacy, call can be transferred to refill team.   1. Which medications need to be refilled? (please list name of each medication and dose if known) Losartan Potassium 50 mg po daily  2. Which pharmacy/location (including street and city if local pharmacy) is medication to be sent to?[facility] in [place]  3. Do they need a 30 day or 90 day supply? 90 days

## 2016-06-11 NOTE — Telephone Encounter (Signed)
E req sent.

## 2016-09-03 ENCOUNTER — Telehealth: Payer: Self-pay | Admitting: Cardiovascular Disease

## 2016-09-03 ENCOUNTER — Other Ambulatory Visit: Payer: Self-pay | Admitting: *Deleted

## 2016-09-03 DIAGNOSIS — Z79899 Other long term (current) drug therapy: Secondary | ICD-10-CM

## 2016-09-03 DIAGNOSIS — E785 Hyperlipidemia, unspecified: Secondary | ICD-10-CM

## 2016-09-03 MED ORDER — ATORVASTATIN CALCIUM 80 MG PO TABS: 80.0000 mg | ORAL_TABLET | Freq: Every day | ORAL | 1 refills | Status: DC

## 2016-09-03 NOTE — Telephone Encounter (Signed)
New message        *STAT* If patient is at the pharmacy, call can be transferred to refill team.   1. Which medications need to be refilled? (please list name of each medication and dose if known) atorvastatin 80mg  2. Which pharmacy/location (including street and city if local pharmacy) is medication to be sent to?[facility] [place]  3. Do they need a 30 day or 90 day supply? 90 day supply and pt request 1 refill.  He will be going to [employer]

## 2017-02-23 ENCOUNTER — Other Ambulatory Visit (HOSPITAL_COMMUNITY): Payer: Medicare Other

## 2017-02-26 ENCOUNTER — Ambulatory Visit (HOSPITAL_COMMUNITY): Payer: Medicare Other | Attending: Cardiology

## 2017-02-26 ENCOUNTER — Ambulatory Visit (INDEPENDENT_AMBULATORY_CARE_PROVIDER_SITE_OTHER): Payer: Medicare Other | Admitting: Cardiovascular Disease

## 2017-02-26 ENCOUNTER — Other Ambulatory Visit: Payer: Self-pay

## 2017-02-26 VITALS — BP 106/74 | HR 62 | Ht 69.0 in | Wt 177.0 lb

## 2017-02-26 DIAGNOSIS — I351 Nonrheumatic aortic (valve) insufficiency: Secondary | ICD-10-CM | POA: Diagnosis present

## 2017-02-26 DIAGNOSIS — R252 Cramp and spasm: Secondary | ICD-10-CM

## 2017-02-26 DIAGNOSIS — I119 Hypertensive heart disease without heart failure: Secondary | ICD-10-CM | POA: Diagnosis not present

## 2017-02-26 DIAGNOSIS — Z79899 Other long term (current) drug therapy: Secondary | ICD-10-CM

## 2017-02-26 DIAGNOSIS — I340 Nonrheumatic mitral (valve) insufficiency: Secondary | ICD-10-CM | POA: Insufficient documentation

## 2017-02-26 DIAGNOSIS — I77810 Thoracic aortic ectasia: Secondary | ICD-10-CM | POA: Insufficient documentation

## 2017-02-26 DIAGNOSIS — E785 Hyperlipidemia, unspecified: Secondary | ICD-10-CM

## 2017-02-26 DIAGNOSIS — I2510 Atherosclerotic heart disease of native coronary artery without angina pectoris: Secondary | ICD-10-CM | POA: Diagnosis not present

## 2017-02-26 DIAGNOSIS — I10 Essential (primary) hypertension: Secondary | ICD-10-CM

## 2017-02-26 DIAGNOSIS — I2584 Coronary atherosclerosis due to calcified coronary lesion: Secondary | ICD-10-CM

## 2017-02-26 DIAGNOSIS — R5383 Other fatigue: Secondary | ICD-10-CM

## 2017-02-26 LAB — COMPREHENSIVE METABOLIC PANEL
ALK PHOS: 144 U/L — AB (ref 40–115)
ALT: 138 U/L — ABNORMAL HIGH (ref 9–46)
AST: 115 U/L — ABNORMAL HIGH (ref 10–35)
Albumin: 4 g/dL (ref 3.6–5.1)
BILIRUBIN TOTAL: 0.6 mg/dL (ref 0.2–1.2)
BUN: 24 mg/dL (ref 7–25)
CALCIUM: 9.6 mg/dL (ref 8.6–10.3)
CO2: 26 mmol/L (ref 20–31)
Chloride: 104 mmol/L (ref 98–110)
Creat: 1.37 mg/dL — ABNORMAL HIGH (ref 0.70–1.18)
Glucose, Bld: 90 mg/dL (ref 65–99)
POTASSIUM: 4.8 mmol/L (ref 3.5–5.3)
SODIUM: 139 mmol/L (ref 135–146)
TOTAL PROTEIN: 7.5 g/dL (ref 6.1–8.1)

## 2017-02-26 LAB — CBC WITH DIFFERENTIAL/PLATELET
BASOS PCT: 0 %
Basophils Absolute: 0 cells/uL (ref 0–200)
EOS ABS: 129 {cells}/uL (ref 15–500)
EOS PCT: 3 %
HEMATOCRIT: 38.2 % — AB (ref 38.5–50.0)
HEMOGLOBIN: 12.8 g/dL — AB (ref 13.2–17.1)
LYMPHS PCT: 22 %
Lymphs Abs: 946 cells/uL (ref 850–3900)
MCH: 29.5 pg (ref 27.0–33.0)
MCHC: 33.5 g/dL (ref 32.0–36.0)
MCV: 88 fL (ref 80.0–100.0)
MONOS PCT: 9 %
MPV: 9.2 fL (ref 7.5–12.5)
Monocytes Absolute: 387 cells/uL (ref 200–950)
NEUTROS ABS: 2838 {cells}/uL (ref 1500–7800)
Neutrophils Relative %: 66 %
Platelets: 149 10*3/uL (ref 140–400)
RBC: 4.34 MIL/uL (ref 4.20–5.80)
RDW: 14.4 % (ref 11.0–15.0)
WBC: 4.3 10*3/uL (ref 3.8–10.8)

## 2017-02-26 LAB — LIPID PANEL
CHOL/HDL RATIO: 2.3 ratio (ref <5.0)
CHOLESTEROL: 124 mg/dL (ref <200)
HDL: 54 mg/dL (ref >40)
LDL CALC: 57 mg/dL (ref <100)
Triglycerides: 64 mg/dL (ref <150)
VLDL: 13 mg/dL (ref <30)

## 2017-02-26 NOTE — Progress Notes (Signed)
Cardiology Office Note   Date:  02/26/2017   ID:  Chris Navarro, DOB 08/05/1935, MRN [id number]  PCP:  Elita Boone, MD  Cardiologist:   Skeet Latch, MD   Chief Complaint  Patient presents with  . Follow-up    follow up ECHO today, no chest pain, has occassional shortness of breath, cramping and lightheadedness      Patient ID: Chris Navarro is a 82 y.o. male with prior stroke, thoracic aortic aneurysm, and moderate aortic regurgitation who presents for follow up.  Chris Navarro was first seen in 09/2015.  He had an episode of syncope while on a transcontinental flight 08/2015.   He was seen at [facility] where his workup was reportedly unremarkable.. The event was thought to be due to intravascular volume depletion. He had a CT scan that was negative for pulmonary embolism, but did show some coronary calcifications. It also showed an ascending thoracic aortic aneurysm of 4.3 cm. He was told to follow-up with cardiologist.  Chris Navarro also had a Holter monitor placed that revealed mostly sinus rhythm with 34 minutes of bradycardia with heart rates in the upper 40s. In addition it revealed a 16 beat run of supraventricular tachycardia.  He denies any symptoms while wearing the monitor and thinks that he was up and getting dressed during the episode of bradycardia.   Chris Navarro was referred for an exercise Cardiolite 09/2015.  He exercised for 6 minutes and there were no perfusion defects. LVEF was 48%.  He subsequently had an echo done in [place] with LVEF 55-60%.  There was moderate LA enlargement, mild mitral regurgitation, and moderate aortic regurgitation.  He was noted to have a mild ascending aortic aneurysm measuring 4.3 cm.  Chris Navarro had a repeat echo 02/26/17 That revealed LVEF 60-65% with mild to moderate aortic regurgitation and a mild ascending aortic aneurysm that was unchanged in size. He has been feeling well overall. He and his wife just returned from [place] last week. He notes  some general fatigue at times, this is especially common after doing yard work. However he is able to do other is activity without any chest pain or shortness of breath. He denies lower extremity edema, orthopnea, or PND. He exercises daily by doing stretches but does not do much cardiovascular exercise.  He does not check his blood pressure at home regularly. He also reports cramping in his calves that is worse at night and better with massage. This occurs approximately once per week.  He endorses mild dizziness when bending over but denies syncope.  Past Medical History:  Diagnosis Date  . Coronary artery calcification 09/19/2015  . Essential hypertension 09/19/2015  . Hyperlipidemia 02/11/2016  . Prostate cancer ([place])   . Skin cancer melanoma  . Stroke (cerebrum) ([place]) 09/19/2015  . Stroke [facility]) mini stroke  . Syncope 09/19/2015  . Thoracic aortic aneurysm ([place]) 09/19/2015   Descending; 4.3 cm 08/2015 on CT scan    Past Surgical History:  Procedure Laterality Date  . HERNIA REPAIR  2005  . [place]  . MELANOMA EXCISION  1994     Current Outpatient Prescriptions  Medication Sig Dispense Refill  . aspirin 81 MG tablet Take 81 mg by mouth daily.    Marland Kitchen atorvastatin (LIPITOR) 80 MG tablet Take 1 tablet (80 mg total) by mouth daily. 90 tablet 1  . losartan (COZAAR) 50 MG tablet Take 1 tablet (50 mg total) by mouth daily. 90 tablet 2  .  Magnesium 500 MG TABS Take 500 mg by mouth daily.     No current facility-administered medications for this visit.     Allergies:   Patient has no known allergies.    Social History:  The patient  reports that he quit smoking about 51 years ago. He does not have any smokeless tobacco history on file. He reports that he drinks about 0.6 - 1.2 oz of alcohol per week .   Family History:  The patient's family history includes Angina in his brother; Cancer in his sister; Diabetes in his brother; Stroke in his father.    ROS:  Please see  the history of present illness.   Otherwise, review of systems are positive for numbness in the right leg and foot..   All other systems are reviewed and negative.    PHYSICAL EXAM: VS:  BP 106/74   Pulse 62   Ht 5\' 9"  (1.753 m)   Wt 80.3 kg (177 lb)   BMI 26.14 kg/m  , BMI Body mass index is 26.14 kg/m. GENERAL:  Well appearing.  No acute distress. HEENT:  Pupils equal round and reactive, fundi not visualized, oral mucosa unremarkable NECK:  No jugular venous distention, waveform within normal limits, carotid upstroke brisk and symmetric, no bruits LYMPHATICS:  No cervical adenopathy LUNGS:  Clear to auscultation bilaterally HEART:  RRR.  PMI not displaced or sustained,S1 and S2 within normal limits, no S3, no S4, no clicks, no rubs, no murmurs ABD:  Flat, positive bowel sounds normal in frequency in pitch, no bruits, no rebound, no guarding, no midline pulsatile mass, no hepatomegaly, no splenomegaly EXT:  2 plus pulses throughout, no edema, no cyanosis no clubbing SKIN:  No rashes no nodules NEURO:  Cranial nerves II through XII grossly intact, motor grossly intact throughout PSYCH:  Cognitively intact, oriented to person place and time  EKG:  EKG is ordered today. The ekg ordered and 10/02/15 demonstrates sinus rhythm rate 76 bpm. 02/26/17: Sinus rhythm. Rate 62 bpm.  Echo 02/26/17: Study Conclusions  - Left ventricle: The cavity size was normal. There was mild   concentric hypertrophy. Systolic function was normal. The   estimated ejection fraction was in the range of 60% to 65%. Wall   motion was normal; there were no regional wall motion   abnormalities. Doppler parameters are consistent with abnormal   left ventricular relaxation (grade 1 diastolic dysfunction).   Doppler parameters are consistent with indeterminate ventricular   filling pressure. - Aortic valve: Transvalvular velocity was within the normal range.   There was no stenosis. There was moderate  regurgitation.   Regurgitation pressure half-time: 398 ms. - Aorta: Ascending aortic diameter: 42.6 mm (S). - Ascending aorta: The ascending aorta was mildly dilated. - Mitral valve: Transvalvular velocity was within the normal range.   There was no evidence for stenosis. There was mild regurgitation. - Left atrium: The atrium was mildly dilated. - Right ventricle: The cavity size was mildly dilated. Wall   thickness was normal. Systolic function was normal. - Tricuspid valve: There was trivial regurgitation.    Recent Labs: No results found for requested labs within last 8760 hours.    Lipid Panel    Component Value Date/Time   CHOL 222 (H) 09/19/2015 0841   TRIG 131 09/19/2015 0841   HDL 53 09/19/2015 0841   CHOLHDL 4.2 09/19/2015 0841   VLDL 26 09/19/2015 0841   LDLCALC 143 (H) 09/19/2015 0841      Wt Readings from Last  3 Encounters:  02/26/17 80.3 kg (177 lb)  02/11/16 79.7 kg (175 lb 12.8 oz)  09/20/15 80.7 kg (178 lb)      ASSESSMENT AND PLAN:   # Aortic regurgitation: Asymtomatic.  Unchanged on echocardiogram today.   # Asymptomatic coronary calcification:  # Hyperlipidemia:  Goal LDL is less than 70. No perfusion deficits were noted on stress testing. Continue aspirin and atorvastatin 80 mg daily.  We will check fasting lipids and LFTs today.    # Hypertension: Blood pressure is low now that was in the 140s over 80s earlier today when he had an echocardiogram. I've asked him to keep a log of blood pressures. Continue losartan. Goal is <130/80.  # Ascending aortic aneurysm: 4.3 cm 02/2017.  Unchanged from prior.  Hypertension management as above.    # Fatigue: symptoms are unchanged from last year. Thyroid testing was abnormal at that time. We'll check a comprehensive metabolic panel and CBC.  # Cramping: Check cmp and magnesium.  Recommended that he hydrate adequately.   Current medicines are reviewed at length with the patient today.  The patient does  not have concerns regarding medicines.  The following changes have been made: none  Labs/ tests ordered today include:   Orders Placed This Encounter  Procedures  . CBC with Differential/Platelet  . Lipid panel  . Comprehensive metabolic panel  . Magnesium  . EKG 12-Lead     Disposition:   FU with Yahayra Geis C. Oval Linsey, MD in 6 months.   Signed, Skeet Latch, MD  02/26/2017 1:54 PM    [place]

## 2017-02-26 NOTE — Patient Instructions (Addendum)
Medication Instructions:  Your physician recommends that you continue on your current medications as directed. Please refer to the Current Medication list given to you today.  Labwork: FASTING LIPID/CMET/MAG/CBC AT [facility] ON THE FIRST FLOOR  Testing/Procedures: NONE  Follow-Up: Your physician wants you to follow-up in: [place] will receive a reminder letter in the mail two months in advance. If you don't receive a letter, please call our office to schedule the follow-up appointment.  Any Other Special Instructions Will Be Listed Below (If Applicable). KEEP A LOG OF YOUR BLOOD PRESSURE AND BRING TO YOUR FOLLOW UP   If you need a refill on your cardiac medications before your next appointment, please call your pharmacy.

## 2017-02-27 LAB — MAGNESIUM: MAGNESIUM: 2 mg/dL (ref 1.5–2.5)

## 2017-03-02 ENCOUNTER — Telehealth: Payer: Self-pay | Admitting: Cardiovascular Disease

## 2017-03-02 DIAGNOSIS — R7989 Other specified abnormal findings of blood chemistry: Secondary | ICD-10-CM

## 2017-03-02 DIAGNOSIS — R945 Abnormal results of liver function studies: Principal | ICD-10-CM

## 2017-03-02 NOTE — Telephone Encounter (Signed)
-----   Message from Skeet Latch, MD sent at 02/28/2017 10:37 PM EDT ----- Cholesterol levels are excellent.  However liver function is elevated.  Stop atorvastatin.  Repeat LFTs in 1 week.

## 2017-03-02 NOTE — Telephone Encounter (Signed)
New message   Pt states that he believes he missed a call from someone yesterday

## 2017-03-02 NOTE — Telephone Encounter (Signed)
Advised patient of lab results  Fax lab order to [phone], phone [phone]

## 2017-03-03 NOTE — Telephone Encounter (Signed)
Confirmed faxed received

## 2017-03-05 ENCOUNTER — Telehealth: Payer: Self-pay | Admitting: Cardiovascular Disease

## 2017-03-05 DIAGNOSIS — R7989 Other specified abnormal findings of blood chemistry: Secondary | ICD-10-CM

## 2017-03-05 DIAGNOSIS — R945 Abnormal results of liver function studies: Principal | ICD-10-CM

## 2017-03-09 ENCOUNTER — Other Ambulatory Visit: Payer: Self-pay

## 2017-03-09 MED ORDER — LOSARTAN POTASSIUM 50 MG PO TABS: 50.0000 mg | ORAL_TABLET | Freq: Every day | ORAL | 3 refills | Status: DC

## 2017-03-11 NOTE — Telephone Encounter (Signed)
-----   Message from Skeet Latch, MD sent at 03/10/2017  4:57 PM EDT ----- Labs reviewed.  Liver function still elevated but improving.  Continue to hold lipitor.  Repeat LFTs in 2 months to make sure it has normalized.

## 2017-03-11 NOTE — Telephone Encounter (Signed)
Advised patient of lab results  Patient states since stopping Atorvastatin has more energy

## 2017-03-18 NOTE — Telephone Encounter (Signed)
Fax lab order to [phone], phone [phone]

## 2017-04-03 ENCOUNTER — Other Ambulatory Visit: Payer: Self-pay | Admitting: Cardiovascular Disease

## 2017-04-06 NOTE — Telephone Encounter (Signed)
Refill Request.  

## 2017-05-10 ENCOUNTER — Telehealth: Payer: Self-pay | Admitting: Cardiovascular Disease

## 2017-05-10 NOTE — Telephone Encounter (Signed)
New message     Needs order for blood work sent to [facility] ?

## 2017-05-11 NOTE — Telephone Encounter (Signed)
Returned call to wife (ok per [facility]) needs lab order faxed to [facility] for patient to have repeat labs drawn per Dr. Oval Linsey.    [place] [facility] to obtain fax #[phone].   Order faxed to # provided.     Wife aware.

## 2017-05-11 NOTE — Telephone Encounter (Signed)
Wife said the hospital still have not received the order.Please notify her when you send the order.Marland Kitchen

## 2017-05-20 ENCOUNTER — Encounter: Payer: Self-pay | Admitting: Cardiovascular Disease

## 2017-05-27 ENCOUNTER — Telehealth: Payer: Self-pay | Admitting: *Deleted

## 2017-05-27 DIAGNOSIS — E785 Hyperlipidemia, unspecified: Secondary | ICD-10-CM

## 2017-05-27 DIAGNOSIS — Z79899 Other long term (current) drug therapy: Secondary | ICD-10-CM

## 2017-05-27 NOTE — Telephone Encounter (Signed)
  Advised patient of lab results  Fax lab order to [phone], phone [phone]

## 2017-05-27 NOTE — Telephone Encounter (Signed)
-----   Message from Skeet Latch, MD sent at 05/24/2017 10:56 PM EDT ----- Liver function is normal.  Let's try atorvastatin 20 mg daily.  Lipids and CMP in 6 weeks.

## 2017-05-28 MED ORDER — ROSUVASTATIN CALCIUM 10 MG PO TABS: 10.0000 mg | ORAL_TABLET | Freq: Every day | ORAL | 5 refills | Status: DC

## 2017-05-28 NOTE — Telephone Encounter (Signed)
Discussed with Dr Oval Linsey further and will have patient start Crestor 10 mg instead of the Atorvastatin 10 mg  Advised patient

## 2017-07-13 ENCOUNTER — Telehealth: Payer: Self-pay | Admitting: Cardiovascular Disease

## 2017-07-13 NOTE — Telephone Encounter (Addendum)
New message      Pt is calling to let the nurse know that [facility] never received the order for a liver panel to be drawn.  Per pt, please refax order.  He did not have the fax number hoping we still had it.  If there is a problem, please call pt.

## 2017-07-13 NOTE — Telephone Encounter (Signed)
Advised patient refaxed and confirmation received Fax lab order to [phone], phone [phone] Reminded him to be fasting when he has labs

## 2017-07-14 ENCOUNTER — Telehealth: Payer: Self-pay | Admitting: Cardiovascular Disease

## 2017-07-14 NOTE — Telephone Encounter (Signed)
New message     Fax blood orders to [facility] in [place]

## 2017-07-15 NOTE — Telephone Encounter (Signed)
Refaxed yesterday and confirmation received  Number originally faxed was to patients PCP

## 2017-09-20 ENCOUNTER — Other Ambulatory Visit: Payer: Self-pay | Admitting: *Deleted

## 2017-09-20 MED ORDER — ROSUVASTATIN CALCIUM 10 MG PO TABS: 10.0000 mg | ORAL_TABLET | Freq: Every day | ORAL | 1 refills | Status: DC

## 2017-09-23 ENCOUNTER — Telehealth: Payer: Self-pay | Admitting: Cardiovascular Disease

## 2017-09-23 NOTE — Telephone Encounter (Signed)
New message  Pt c/o medication issue:  1. Name of Medication: losartan (COZAAR) 50 MG tablet  2. How are you currently taking this medication (dosage and times per day)? As prescribed  3. Are you having a reaction (difficulty breathing--STAT)? No  4. What is your medication issue? Per pt heard there was a recall and wanted to see if he need to stop medication or continue to take it. Requesting call back

## 2017-09-23 NOTE — Telephone Encounter (Signed)
Returned pt's medication is not included in the recall but he will call them just to make sure

## 2017-11-01 IMAGING — NM NM MISC PROCEDURE
6 series · 36 of 36 positions shown · non-contrast
Comparison: none

[Series 1: wbr_r-proj_st wbr rest · 6.40mm/px · 6 of 64 frames shown]
[frame 6/64]
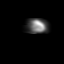
[frame 16/64]
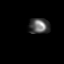
[frame 27/64]
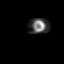
[frame 38/64]
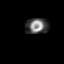
[frame 48/64]
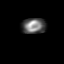
[frame 59/64]
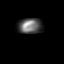

[Series 1: wbr rest · 6.40mm/px · 6 of 64 frames shown]
[frame 6/64]
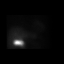
[frame 16/64]
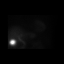
[frame 27/64]
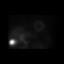
[frame 38/64]
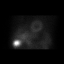
[frame 48/64]
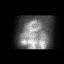
[frame 59/64]
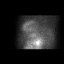

[Series 2: wbr_s-proj_st wbr stress-gsp · 6.40mm/px · 6 of 512 frames shown]
[frame 43/512]
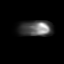
[frame 128/512]
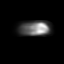
[frame 214/512]
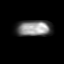
[frame 299/512]
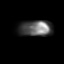
[frame 384/512]
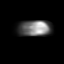
[frame 470/512]
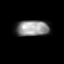

[Series 2: wbr stress-gsp · 6.40mm/px · 6 of 512 frames shown]
[frame 43/512]
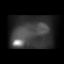
[frame 128/512]
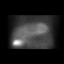
[frame 214/512]
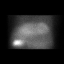
[frame 299/512]
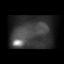
[frame 384/512]
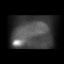
[frame 470/512]
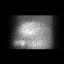

[Series 3: wbr stress-sum-em · 6.40mm/px · 6 of 64 frames shown]
[frame 6/64]
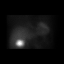
[frame 16/64]
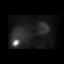
[frame 27/64]
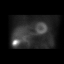
[frame 38/64]
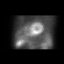
[frame 48/64]
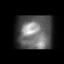
[frame 59/64]
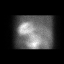

[Series 3: wbr_s-proj_st wbr stress-sum-em · 6.40mm/px · 6 of 64 frames shown]
[frame 6/64]
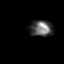
[frame 16/64]
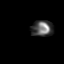
[frame 27/64]
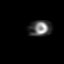
[frame 38/64]
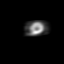
[frame 48/64]
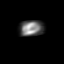
[frame 59/64]
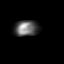

[36 of 36 positions shown; findings below may reference images not displayed]

Canned report from images found in remote index.

Refer to host system for actual result text.

## 2017-11-15 ENCOUNTER — Telehealth: Payer: Self-pay | Admitting: Cardiovascular Disease

## 2017-11-15 NOTE — Telephone Encounter (Signed)
Left message to call back to discuss Good Rx $12.57 at [facility] with coupon

## 2017-11-15 NOTE — Telephone Encounter (Signed)
New MEssage  Pt c/o medication issue:  1. Name of Medication: rosuvastatin  2. How are you currently taking this medication (dosage and times per day)? 10mg   3. Are you having a reaction (difficulty breathing--STAT)? no  4. What is your medication issue? Per pt states the medication is to expensive. Pt would like to discuss an alternate medication.

## 2017-11-22 NOTE — Telephone Encounter (Signed)
Spoke with pt, he reports there are no [facility] in [place]. ? What else can he try.?

## 2017-11-24 NOTE — Telephone Encounter (Signed)
[employer], [employer], and [employer] have similar coupons with Good Rx  Left message to call back

## 2017-11-24 NOTE — Telephone Encounter (Signed)
Routed to [place], [place]

## 2017-12-14 NOTE — Telephone Encounter (Signed)
Left message to call back  

## 2017-12-23 ENCOUNTER — Other Ambulatory Visit: Payer: Self-pay | Admitting: Cardiovascular Disease

## 2017-12-23 NOTE — Telephone Encounter (Signed)
Please review for refill. Thanks!  

## 2017-12-23 NOTE — Telephone Encounter (Signed)
REFILL 

## 2018-02-15 NOTE — Telephone Encounter (Signed)
Patient never returned call  

## 2018-02-27 ENCOUNTER — Other Ambulatory Visit: Payer: Self-pay | Admitting: Cardiovascular Disease

## 2018-02-28 NOTE — Telephone Encounter (Signed)
Refill Request.  

## 2018-03-10 ENCOUNTER — Telehealth: Payer: Self-pay | Admitting: Cardiovascular Disease

## 2018-03-10 DIAGNOSIS — E785 Hyperlipidemia, unspecified: Secondary | ICD-10-CM

## 2018-03-10 DIAGNOSIS — I10 Essential (primary) hypertension: Secondary | ICD-10-CM

## 2018-03-10 NOTE — Telephone Encounter (Signed)
Pt is calling:   Pt need an appt and wants to know if any test or labs are needed, due to pt has a 3hr drive to office. Please advise pt.

## 2018-03-10 NOTE — Telephone Encounter (Signed)
Pt is due for f/u appt not sure if pt needs a  repeat echo as well. Will forward to Dr Oval Linsey review and recommendations .Adonis Housekeeper

## 2018-03-11 NOTE — Telephone Encounter (Signed)
Just lipids and a CMP.  He can do them when he comes or get them at a local lab prior to the appointment.

## 2018-03-15 NOTE — Telephone Encounter (Signed)
Advised patient and scheduled follow up  

## 2018-03-25 ENCOUNTER — Other Ambulatory Visit: Payer: Self-pay | Admitting: Cardiovascular Disease

## 2018-03-25 NOTE — Telephone Encounter (Signed)
Rx request sent to pharmacy.  

## 2018-04-20 ENCOUNTER — Other Ambulatory Visit: Payer: Self-pay | Admitting: Cardiovascular Disease

## 2018-04-21 ENCOUNTER — Other Ambulatory Visit: Payer: Self-pay | Admitting: Cardiovascular Disease

## 2018-04-21 NOTE — Telephone Encounter (Signed)
REFILL 

## 2018-06-28 ENCOUNTER — Encounter: Payer: Self-pay | Admitting: Cardiovascular Disease

## 2018-06-28 ENCOUNTER — Ambulatory Visit (INDEPENDENT_AMBULATORY_CARE_PROVIDER_SITE_OTHER): Payer: Medicare Other | Admitting: Cardiovascular Disease

## 2018-06-28 VITALS — BP 136/78 | HR 53 | Ht 68.0 in | Wt 179.4 lb

## 2018-06-28 DIAGNOSIS — I2584 Coronary atherosclerosis due to calcified coronary lesion: Secondary | ICD-10-CM

## 2018-06-28 DIAGNOSIS — I10 Essential (primary) hypertension: Secondary | ICD-10-CM

## 2018-06-28 DIAGNOSIS — E785 Hyperlipidemia, unspecified: Secondary | ICD-10-CM

## 2018-06-28 DIAGNOSIS — I2510 Atherosclerotic heart disease of native coronary artery without angina pectoris: Secondary | ICD-10-CM | POA: Diagnosis not present

## 2018-06-28 DIAGNOSIS — I351 Nonrheumatic aortic (valve) insufficiency: Secondary | ICD-10-CM | POA: Diagnosis not present

## 2018-06-28 NOTE — Progress Notes (Signed)
Cardiology Office Note   Date:  06/29/2018   ID:  Chris Navarro, DOB 1935-05-31, MRN [id number]  PCP:  Elita Boone, MD  Cardiologist:   Skeet Latch, MD   No chief complaint on file.    Patient ID: Chris Navarro is an 83 y.o. male with prior stroke, bradycardia, moderate ascending aortic aneurysm, and moderate aortic regurgitation who presents for follow up.  [name] was first seen in 09/2015.  He had an episode of syncope while on a transcontinental flight 08/2015.   He was seen at [facility] where his workup was reportedly unremarkable. The event was thought to be due to intravascular volume depletion. He had a CT scan that was negative for pulmonary embolism, but did show some coronary calcifications. It also showed an ascending thoracic aortic aneurysm of 4.3 cm. He was told to follow-up with cardiologist.  [name] also had a Holter monitor placed that revealed mostly sinus rhythm with 34 minutes of bradycardia with heart rates in the upper 40s. In addition it revealed a 16 beat run of supraventricular tachycardia.  He denies any symptoms while wearing the monitor and thinks that he was up and getting dressed during the episode of bradycardia.   [name] was referred for an exercise Cardiolite 09/2015.  He exercised for 6 minutes and there were no perfusion defects. LVEF was 48%.  He subsequently had an echo done in [place] with LVEF 55-60%.  There was moderate LA enlargement, mild mitral regurgitation, and moderate aortic regurgitation.  He was noted to have a mild ascending aortic aneurysm measuring 4.3 cm.  [name] had a repeat echo 02/26/17 that revealed LVEF 60-65% with mild to moderate aortic regurgitation and a mild ascending aortic aneurysm that was unchanged in size.  Since his last appointment [name] has been feeling well.  He has not had any chest pain or shortness of breath.  He plays golf 3 times per week.  He sometimes rides and sometimes walks the course.  He has  no exertional symptoms.  He has not noted any lower extremity edema, orthopnea, or PND.  He denies any lightheadedness or dizziness.  His only complaint is that he falls asleep quickly.  He feels well-rested in the morning and does not snore.  He frequently wakes up in the night and has a hard time falling back asleep.  On average she gets 6 hours of sleep.   Past Medical History:  Diagnosis Date  . Coronary artery calcification 09/19/2015  . Essential hypertension 09/19/2015  . Hyperlipidemia 02/11/2016  . Prostate cancer ([place])   . Skin cancer melanoma  . Stroke (cerebrum) ([place]) 09/19/2015  . Stroke [facility]) mini stroke  . Syncope 09/19/2015  . Thoracic aortic aneurysm ([place]) 09/19/2015   Descending; 4.3 cm 08/2015 on CT scan    Past Surgical History:  Procedure Laterality Date  . HERNIA REPAIR  2005  . [place]  . MELANOMA EXCISION  1994     Current Outpatient Medications  Medication Sig Dispense Refill  . aspirin 81 MG tablet Take 81 mg by mouth daily.    Marland Kitchen losartan (COZAAR) 50 MG tablet TAKE 1 TABLET (50 MG TOTAL) BY MOUTH DAILY. CONTACT OUR OFFICE FOR AN APPOINTMENT 30 tablet 2  . Magnesium 500 MG TABS Take 500 mg by mouth daily.    . rosuvastatin (CRESTOR) 10 MG tablet TAKE 1 TABLET BY MOUTH EVERY DAY 90 tablet 0   No current facility-administered medications  for this visit.     Allergies:   Atorvastatin    Social History:  The patient  reports that he quit smoking about 52 years ago. He has never used smokeless tobacco. He reports that he drinks about 1.0 - 2.0 standard drinks of alcohol per week.   Family History:  The patient's family history includes Angina in his brother; Cancer in his sister; Diabetes in his brother; Stroke in his father.    ROS:  Please see the history of present illness.   Otherwise, review of systems are positive for numbness in the right leg and foot..   All other systems are reviewed and negative.    PHYSICAL EXAM: VS:  BP  136/78   Pulse (!) 53   Ht 5\' 8"  (1.727 m)   Wt 179 lb 6.4 oz (81.4 kg)   BMI 27.28 kg/m  , BMI Body mass index is 27.28 kg/m. GENERAL:  Well appearing HEENT: Pupils equal round and reactive, fundi not visualized, oral mucosa unremarkable NECK:  No jugular venous distention, waveform within normal limits, carotid upstroke brisk and symmetric, no bruits LUNGS:  Clear to auscultation bilaterally HEART:  RRR.  PMI not displaced or sustained,S1 and S2 within normal limits, no S3, no S4, no clicks, no rubs, II/IV diastolic murmur at the LUSB ABD:  Flat, positive bowel sounds normal in frequency in pitch, no bruits, no rebound, no guarding, no midline pulsatile mass, no hepatomegaly, no splenomegaly EXT:  2 plus pulses throughout, no edema, no cyanosis no clubbing SKIN:  No rashes no nodules NEURO:  Cranial nerves II through XII grossly intact, motor grossly intact throughout PSYCH:  Cognitively intact, oriented to person place and time   EKG:  EKG is ordered today. The ekg ordered and 10/02/15 demonstrates sinus rhythm rate 76 bpm. 02/26/17: Sinus rhythm. Rate 62 bpm. 06/28/18: Sinus bradycardia.  Rate 53 bpm.    Echo 02/26/17: Study Conclusions  - Left ventricle: The cavity size was normal. There was mild   concentric hypertrophy. Systolic function was normal. The   estimated ejection fraction was in the range of 60% to 65%. Wall   motion was normal; there were no regional wall motion   abnormalities. Doppler parameters are consistent with abnormal   left ventricular relaxation (grade 1 diastolic dysfunction).   Doppler parameters are consistent with indeterminate ventricular   filling pressure. - Aortic valve: Transvalvular velocity was within the normal range.   There was no stenosis. There was moderate regurgitation.   Regurgitation pressure half-time: 398 ms. - Aorta: Ascending aortic diameter: 42.6 mm (S). - Ascending aorta: The ascending aorta was mildly dilated. - Mitral  valve: Transvalvular velocity was within the normal range.   There was no evidence for stenosis. There was mild regurgitation. - Left atrium: The atrium was mildly dilated. - Right ventricle: The cavity size was mildly dilated. Wall   thickness was normal. Systolic function was normal. - Tricuspid valve: There was trivial regurgitation.    Recent Labs: No results found for requested labs within last 8760 hours.    Lipid Panel    Component Value Date/Time   CHOL 124 02/26/2017 1410   TRIG 64 02/26/2017 1410   HDL 54 02/26/2017 1410   CHOLHDL 2.3 02/26/2017 1410   VLDL 13 02/26/2017 1410   LDLCALC 57 02/26/2017 1410      Wt Readings from Last 3 Encounters:  06/28/18 179 lb 6.4 oz (81.4 kg)  02/26/17 177 lb (80.3 kg)  02/11/16 175 lb 12.8  oz (79.7 kg)      ASSESSMENT AND PLAN:   # Aortic regurgitation: Moderate on his last echo 02/2017.  We will repeat his echo 03/2019.  He was advised of warning signs of worsening valvular heart disease or heart failure.  # Asymptomatic coronary calcification:  # Hyperlipidemia:  Goal LDL is less than 70. No perfusion deficits were noted on stress testing. Continue aspirin and atorvastatin 80 mg daily.  LDL was 62 on 03/2017.  # Hypertension: His goal blood pressures less than 130/80.  He has been running slightly over this level.  We discussed limiting salt and increasing his exercise instead of increasing his antihypertensives.  He is not on a beta-blocker due to bradycardia.  If his blood pressure has not improved at follow-up we will need to adjust his medication.  # Ascending aortic aneurysm: 4.3 cm 02/2017.  Unchanged from prior.  Hypertension management as above.  Repeat echo as above.  # Obesity:  Mr. Liberatore has a goal of losing 10 pounds by his next appointment.   Current medicines are reviewed at length with the patient today.  The patient does not have concerns regarding medicines.  The following changes have been made:  none  Labs/ tests ordered today include:   Orders Placed This Encounter  Procedures  . EKG 12-Lead  . ECHOCARDIOGRAM COMPLETE     Disposition:   FU with Glynis Hunsucker C. Oval Linsey, MD in 03/2019.   Signed, Skeet Latch, MD  06/29/2018 10:36 AM    [place]

## 2018-06-28 NOTE — Patient Instructions (Addendum)
Medication Instructions:  Your physician recommends that you continue on your current medications as directed. Please refer to the Current Medication list given to you today.  Labwork: NONE  Testing/Procedures: Your physician has requested that you have an echocardiogram. Echocardiography is a painless test that uses sound waves to create images of your heart. It provides your doctor with information about the size and shape of your heart and how well your heart's chambers and valves are working. This procedure takes approximately one hour. There are no restrictions for this procedure. [facility] AT [place] [address] MORNING APPOINTMENT IN MAY  Follow-Up: Your physician recommends that you schedule a follow-up appointment in: IN MAY, WILL TRY TO [place] AFTER YOUR ECHO APPOINTMENT   If you need a refill on your cardiac medications before your next appointment, please call your pharmacy.

## 2018-06-29 ENCOUNTER — Encounter: Payer: Self-pay | Admitting: Cardiovascular Disease

## 2018-07-20 ENCOUNTER — Other Ambulatory Visit: Payer: Self-pay | Admitting: Cardiovascular Disease

## 2018-07-28 ENCOUNTER — Other Ambulatory Visit: Payer: Self-pay | Admitting: Cardiovascular Disease

## 2019-01-13 ENCOUNTER — Other Ambulatory Visit: Payer: Self-pay | Admitting: Cardiovascular Disease

## 2019-03-08 ENCOUNTER — Telehealth: Payer: Self-pay | Admitting: *Deleted

## 2019-03-08 NOTE — Telephone Encounter (Signed)
03/08/19 LMOM @ 0949 am, re: follow up appointment.

## 2019-03-09 NOTE — Telephone Encounter (Signed)
Pt  Returned Deborah's call

## 2019-03-10 NOTE — Telephone Encounter (Signed)
Follow up    Patient is returning call in reference to appts.

## 2019-03-17 ENCOUNTER — Telehealth: Payer: Self-pay | Admitting: *Deleted

## 2019-03-17 NOTE — Telephone Encounter (Signed)
Left message to call back  PATIENT NEEDS VIRTUAL VISIT SCHEDULED, OK WITH DR Godwin OR APP

## 2019-03-17 NOTE — Telephone Encounter (Signed)
Left message to call back  PATIENT NEEDS VIRTUAL VISIT SCHEDULED, OK WITH DR [place] OR APP

## 2019-03-17 NOTE — Telephone Encounter (Signed)
New  Message   Patient only able to do a telephone call please call patient to setup for the televisit.

## 2019-03-24 ENCOUNTER — Telehealth (HOSPITAL_COMMUNITY): Payer: Self-pay | Admitting: Radiology

## 2019-03-24 NOTE — Telephone Encounter (Signed)
COVID-19 Pre-Screening Questions:  . Do you currently have a fever? NO (yes = cancel and refer to pcp for e-visit) . Have you recently travelled on a cruise, internationally, or to [place], [place], [place], [place], [place], or [place], [place] [employer]) ? Wintered in [place] but have been in [place] since the 1st week in April - (yes = cancel, stay home, monitor symptoms, and contact pcp or initiate e-visit if symptoms develop) . Have you been in contact with someone that is currently pending confirmation of Covid19 testing or has been confirmed to have the [place] virus?  NO (yes = cancel, stay home, away from tested individual, monitor symptoms, and contact pcp or initiate e-visit if symptoms develop) . Are you currently experiencing fatigue or cough? No (yes = pt should be prepared to have a mask placed at the time of their visit). . Reiterated no additional visitors. Eartha Inch no earlier than 15 minutes before appointment time. . Please bring own mask.

## 2019-03-28 ENCOUNTER — Other Ambulatory Visit: Payer: Self-pay

## 2019-03-28 ENCOUNTER — Ambulatory Visit (HOSPITAL_COMMUNITY): Payer: Medicare Other | Attending: Cardiology

## 2019-03-28 DIAGNOSIS — I351 Nonrheumatic aortic (valve) insufficiency: Secondary | ICD-10-CM

## 2019-03-29 ENCOUNTER — Telehealth: Payer: Self-pay | Admitting: Physician Assistant

## 2019-03-29 NOTE — Telephone Encounter (Signed)
Home phone/ my chart active/ consent/ pre reg completed

## 2019-03-31 ENCOUNTER — Telehealth (INDEPENDENT_AMBULATORY_CARE_PROVIDER_SITE_OTHER): Payer: Medicare Other | Admitting: Physician Assistant

## 2019-03-31 ENCOUNTER — Other Ambulatory Visit: Payer: Self-pay

## 2019-03-31 VITALS — BP 123/58 | HR 46 | Ht 68.0 in | Wt 182.0 lb

## 2019-03-31 DIAGNOSIS — I351 Nonrheumatic aortic (valve) insufficiency: Secondary | ICD-10-CM | POA: Diagnosis not present

## 2019-03-31 DIAGNOSIS — R001 Bradycardia, unspecified: Secondary | ICD-10-CM

## 2019-03-31 DIAGNOSIS — I712 Thoracic aortic aneurysm, without rupture, unspecified: Secondary | ICD-10-CM

## 2019-03-31 DIAGNOSIS — Z8673 Personal history of transient ischemic attack (TIA), and cerebral infarction without residual deficits: Secondary | ICD-10-CM

## 2019-03-31 NOTE — Progress Notes (Signed)
Virtual Visit via Telephone Note   This visit type was conducted due to national recommendations for restrictions regarding the COVID-19 Pandemic (e.g. social distancing) in an effort to limit this patient's exposure and mitigate transmission in our community.  Due to his co-morbid illnesses, this patient is at least at moderate risk for complications without adequate follow up.  This format is felt to be most appropriate for this patient at this time.  The patient did not have access to video technology/had technical difficulties with video requiring transitioning to audio format only (telephone).  All issues noted in this document were discussed and addressed.  No physical exam could be performed with this format.  Please refer to the patient's chart for his  consent to telehealth for [facility].   Date:  04/02/2019   ID:  [name], DOB 10-Jul-1935, MRN [id number]  Patient Location: Home Provider Location: Home  PCP:  Elita Boone, MD  Cardiologist:  Skeet Latch, MD  Electrophysiologist:  None   Evaluation Performed:  Follow-Up Visit  Chief Complaint:  followup  History of Present Illness:    Chris Navarro is a 84 y.o. male with prior CVA, bradycardia, moderate ascending aortic aneurysm, moderate aortic regurgitation.  He had a episode of syncope in October 2016 during a transcontinental flight.  He is work-up at [facility] was unremarkable.  The event was felt to be related to volume depletion.  CT scan at the time was negative for PE however did show some coronary calcification.  He also revealed a ascending thoracic aortic aneurysm of 4.3 cm.  Holter monitor showed mostly sinus rhythm was 34 minutes of bradycardia with heart rate in the upper 40s.  He also revealed 61 beats run of SVT.  A exercise Myoview in November 2016 which showed EF of 48%, no perfusion defect.  Subsequent echocardiogram performed in [place] showed EF of 55 to 60%, moderate LAE, mild MR,  moderate aortic regurgitation.  Repeat echocardiogram in April 2018 showed EF of 60 to 65%, mild to moderate aortic regurgitation, mild ascsending aorta aortic aneurysm unchanged when compared to the previous image.  Repeat echocardiogram obtained on 03/28/2019 showed EF of 55 to 60%, mild aortic regurgitation, ascending aorta measuring at 46 mm.  Patient was last seen by Dr. Oval Linsey in August 2019 at which time he denied any chest pain or shortness of breath he was doing well.  Patient was contacted today via telephone visit.  He denies any recent chest pain or shortness of breath.  His blood pressure has been very well controlled at home.  Today's blood pressure was 115/67.  Despite his occasional bradycardia, he denies any dizziness, blurred vision or feeling of passing out.  His most recent echocardiogram did reveal his a sending thoracic aorta increased in size from the previous 42 mm to 46 mm.  I recommend biannual monitoring from this point forward.  I will obtain a CT of the chest in 6 months.  The patient does not have symptoms concerning for COVID-19 infection (fever, chills, cough, or new shortness of breath).    Past Medical History:  Diagnosis Date   Coronary artery calcification 09/19/2015   Essential hypertension 09/19/2015   Hyperlipidemia 02/11/2016   Prostate cancer ([place])    Skin cancer melanoma   Stroke (cerebrum) ([place]) 09/19/2015   Stroke ([place]) mini stroke   Syncope 09/19/2015   Thoracic aortic aneurysm ([place]) 09/19/2015   Descending; 4.3 cm 08/2015 on CT scan   Past Surgical History:  Procedure Laterality Date   HERNIA REPAIR  2005   KIDNEY STONE SURGERY  1990   MELANOMA EXCISION  1994     Current Meds  Medication Sig   aspirin 81 MG tablet Take 81 mg by mouth Navarro.   losartan (COZAAR) 50 MG tablet Take 1 tablet (50 mg total) by mouth Navarro.   Magnesium 500 MG TABS Take 500 mg by mouth Navarro.   rosuvastatin (CRESTOR) 10 MG tablet TAKE 1 TABLET BY  MOUTH EVERY DAY     Allergies:   Atorvastatin   Social History   Tobacco Use   Smoking status: Former Smoker    Last attempt to quit: 09/18/1965    Years since quitting: 53.5   Smokeless tobacco: Never Used  Substance Use Topics   Alcohol use: Yes    Alcohol/week: 1.0 - 2.0 standard drinks    Types: 1 - 2 Glasses of wine per week   Drug use: Not on file     Family Hx: The patient's family history includes Angina in his brother; Cancer in his sister; Diabetes in his brother; Stroke in his father.  ROS:   Please see the history of present illness.     All other systems reviewed and are negative.   Prior CV studies:   The following studies were reviewed today:  Echo 03/28/2019 IMPRESSIONS    1. The left ventricle has normal systolic function, with an ejection fraction of 55-60%. The cavity size was mildly dilated. Left ventricular diastolic Doppler parameters are consistent with impaired relaxation.  2. The right ventricle has normal systolic function. The cavity was normal.  3. The mitral valve is abnormal. Mild thickening of the mitral valve leaflet.  4. The tricuspid valve is grossly normal.  5. The aortic valve is tricuspid. Mild thickening of the aortic valve. Aortic valve regurgitation is mild by color flow Doppler. No stenosis of the aortic valve.  6. Aneurysm of the ascending aorta, measuring 46 mm.  7. The interatrial septum is aneurysmal.  8. Normal LV systolic function; mild LVE; mild diastolic dysfunction; ascending aortic aneurysm (4.6 cm); mild AI and MR.  Labs/Other Tests and Data Reviewed:    EKG:  An ECG dated 06/28/2018 was personally reviewed today and demonstrated:  Sinus bradycardia, no significant ST-T wave changes.  Recent Labs: No results found for requested labs within last 8760 hours.   Recent Lipid Panel Lab Results  Component Value Date/Time   CHOL 124 02/26/2017 02:10 PM   TRIG 64 02/26/2017 02:10 PM   HDL 54 02/26/2017 02:10 PM    CHOLHDL 2.3 02/26/2017 02:10 PM   LDLCALC 57 02/26/2017 02:10 PM    Wt Readings from Last 3 Encounters:  03/31/19 182 lb (82.6 kg)  06/28/18 179 lb 6.4 oz (81.4 kg)  02/26/17 177 lb (80.3 kg)     Objective:    Vital Signs:  BP (!) 123/58 Comment: BP reading from the other day.   Pulse (!) 46    Ht 5\' 8"  (1.727 m)    Wt 182 lb (82.6 kg)    BMI 27.67 kg/m    VITAL SIGNS:  reviewed  ASSESSMENT & PLAN:    1. Thoracic aortic aneurysm  -Recent echocardiogram shows ascending aortic aneurysm has reached 4.6 cm.  Will start biannual imaging, I plan to obtain a CTA of aorta in 6 months.  -Continue blood pressure control using losartan.  Blood pressure is normal.  2. Aortic regurgitation: Mild by most recent echocardiogram  3. History  of CVA: No recurrence  4. Asymptomatic bradycardia: Not on any AV nodal blocking agent.  He is asymptomatic from this  COVID-19 Education: The signs and symptoms of COVID-19 were discussed with the patient and how to seek care for testing (follow up with PCP or arrange E-visit).  The importance of social distancing was discussed today.  Time:   Today, I have spent 10 minutes with the patient with telehealth technology discussing the above problems.     Medication Adjustments/Labs and Tests Ordered: Current medicines are reviewed at length with the patient today.  Concerns regarding medicines are outlined above.   Tests Ordered: Orders Placed This Encounter  Procedures   CT ANGIO CHEST AORTA W &/OR WO CONTRAST   Basic metabolic panel    Medication Changes: No orders of the defined types were placed in this encounter.   Disposition:  Follow up in 7 month(s)  Signed, Almyra Deforest, [place]  04/02/2019 8:12 PM    [facility]

## 2019-03-31 NOTE — Patient Instructions (Addendum)
Medication Instructions:   Your physician recommends that you continue on your current medications as directed. Please refer to the Current Medication list given to you today.  If you need a refill on your cardiac medications before your next appointment, please call your pharmacy.   Lab work:  You will need to have labs (blood work) drawn in 6 months prior to procedure:   BMET  If you have labs (blood work) drawn today and your tests are completely normal, you will receive your results only by: Marland Kitchen MyChart Message (if you have MyChart) OR . A paper copy in the mail If you have any lab test that is abnormal or we need to change your treatment, we will call you to review the results.  Testing/Procedures:  Non-Cardiac CT scanning, (CAT scanning), is a noninvasive, special x-ray that produces cross-sectional images of the body using x-rays and a computer. CT scans help physicians diagnose and treat medical conditions. For some CT exams, a contrast material is used to enhance visibility in the area of the body being studied. CT scans provide greater clarity and reveal more details than regular x-ray exams.    Follow-Up: At [facility], you and your health needs are our priority.  As part of our continuing mission to provide you with exceptional heart care, we have created designated Provider Care Teams.  These Care Teams include your primary Cardiologist (physician) and Advanced Practice Providers (APPs -  Physician Assistants and Nurse Practitioners) who all work together to provide you with the care you need, when you need it.   You will need a follow up appointment in 7-8 months.  Please call our office 2 months in advance to schedule this appointment.  You may see Skeet Latch, MD or one of the following Advanced Practice Providers on your designated Care Team:   Kerin Ransom, PA-C Roby Lofts, [place] . Sande Rives, PA-C  Any Other Special Instructions Will Be Listed Below (If  Applicable).

## 2019-04-07 ENCOUNTER — Other Ambulatory Visit: Payer: Self-pay

## 2019-04-07 DIAGNOSIS — I359 Nonrheumatic aortic valve disorder, unspecified: Secondary | ICD-10-CM

## 2019-04-07 NOTE — Progress Notes (Signed)
Notes recorded by Skeet Latch, MD on 04/06/2019 at 4:14 PM EDT Heart is squeezing well. Aorta is moderately dilated. There was an increase from 4.26cm to 4.6 cm. Repeat echo in 6 months. There is only mild leaking of the aortic valve, improved from moderate on the last one.

## 2019-04-26 ENCOUNTER — Other Ambulatory Visit: Payer: Self-pay | Admitting: Cardiovascular Disease

## 2019-07-31 ENCOUNTER — Other Ambulatory Visit: Payer: Self-pay | Admitting: Cardiovascular Disease

## 2019-09-20 ENCOUNTER — Other Ambulatory Visit: Payer: Self-pay

## 2019-09-20 DIAGNOSIS — I712 Thoracic aortic aneurysm, without rupture, unspecified: Secondary | ICD-10-CM

## 2019-09-20 NOTE — Progress Notes (Signed)
New order placed

## 2019-09-27 ENCOUNTER — Telehealth: Payer: Self-pay | Admitting: Cardiovascular Disease

## 2019-09-27 NOTE — Telephone Encounter (Signed)
Patient ready to be scheduled for CT Angio Chest Aorta W &/OR WO Contrast.

## 2019-10-02 ENCOUNTER — Telehealth: Payer: Self-pay | Admitting: Cardiovascular Disease

## 2019-10-02 NOTE — Telephone Encounter (Signed)
Okay to reorder for this hospital? Not sure how we would get the results unless they send it to [place] after it is completed?

## 2019-10-02 NOTE — Telephone Encounter (Signed)
Patient cannot schedule CT due to his insurance. He was wondering if a new order could be put in so he can get it done at [facility] in [place], [place].

## 2019-10-04 NOTE — Telephone Encounter (Signed)
I see he has an ECHO to be done- but I think he was referring to his CT- I tried to order it with that Facility, but it did not give me the option. I will ask if Chris Navarro is aware of how to do this?

## 2019-10-04 NOTE — Telephone Encounter (Signed)
I am ok with him obtaining echo at another facility, but not sure how I would do it

## 2019-10-04 NOTE — Telephone Encounter (Signed)
Thank you :)

## 2019-10-09 NOTE — Telephone Encounter (Signed)
Called and spoke with wife- advised that we were working on a way to get the CT order sent there, I will reach out to Kim to advise any further- I tried to order it through them, but they do not come up for order, we could try to fax the order, to them but not sure how it works?  Patient wife verbalized understanding.

## 2019-10-09 NOTE — Telephone Encounter (Signed)
Follow up:     Patient wife calling stating that they have been waiting on someone to call them concerning a CT. Please call patient back.

## 2019-10-11 NOTE — Telephone Encounter (Signed)
LM with scheduling dept at [facility] concerning CT test

## 2019-10-11 NOTE — Telephone Encounter (Signed)
Patient cannot have CT at [facility] d/t medicare. He can have at [facility]. MyChart message sent asking if he wants test at [facility] or if he wants a [facility], to please provide [place] with the contact information of the test facility  Routed to primary nurse

## 2019-10-16 NOTE — Telephone Encounter (Signed)
Order faxed to [facility] per pt ./cy

## 2019-10-16 NOTE — Telephone Encounter (Signed)
Faxed via [place] fax function

## 2019-10-16 NOTE — Telephone Encounter (Signed)
Patient is calling stating Chris Navarro still has not received request fax # [phone]. He would like a callback in regards to the request status. Please Advise.

## 2019-10-19 ENCOUNTER — Encounter: Payer: Self-pay | Admitting: Cardiovascular Disease

## 2019-11-10 NOTE — Progress Notes (Signed)
This was reviewed yesterday, see separate phone note regarding result

## 2019-11-28 ENCOUNTER — Telehealth: Payer: Self-pay | Admitting: Cardiovascular Disease

## 2019-11-28 NOTE — Telephone Encounter (Signed)
Spoke with patient regarding CT scan. Notified that we have not yet received the results. Pt asked about his blood work that was done with the test. Notified that this was in our chart.  Primary aware and will follow up.

## 2019-11-28 NOTE — Telephone Encounter (Signed)
New message   Patient is wanting to go over his Ct Aorta test results. Please call to discuss.

## 2019-11-29 ENCOUNTER — Telehealth: Payer: Self-pay | Admitting: Physician Assistant

## 2019-11-29 NOTE — Telephone Encounter (Signed)
Outside CT image from [facility] has been reviewed. Per image, his ascending thoracic aorta was measured at 4.2 cm. This is essentially unchanged when compare to previous image result from [facility] in 2016. Although echo in 2020 demonstrated the size of ascending aorta increased to 4.6 cm, however CT is a more accurate way to assess the size of thoracic aorta. Therefore, this is a quite reassuring study in the fact that it demonstrate his thoracic aortic aneurysm is unchanged in size when compare to 2016 image. Since the size is <4.5 cm, will resume yearly image instead of every half a year. Next CT of chest would be 10/2020.

## 2019-11-30 NOTE — Telephone Encounter (Signed)
Called and spoke with patient on 11/29/2019. Patient was notified of the result/comments of Almyra Deforest, [place]. Patient verbalized understanding.  All questions (if any) were answered. Jacqulynn Cadet, [place] 11/30/2019 2:31 PM

## 2019-12-05 NOTE — Telephone Encounter (Signed)
See phone note 1/20, patients results given

## 2019-12-07 ENCOUNTER — Ambulatory Visit: Payer: Medicare Other

## 2019-12-15 ENCOUNTER — Ambulatory Visit: Payer: Medicare Other

## 2019-12-16 ENCOUNTER — Ambulatory Visit: Payer: Medicare Other

## 2020-02-01 ENCOUNTER — Encounter: Payer: Self-pay | Admitting: Cardiovascular Disease

## 2020-02-01 ENCOUNTER — Ambulatory Visit (INDEPENDENT_AMBULATORY_CARE_PROVIDER_SITE_OTHER): Payer: Medicare Other | Admitting: Cardiovascular Disease

## 2020-02-01 ENCOUNTER — Other Ambulatory Visit: Payer: Self-pay

## 2020-02-01 VITALS — BP 146/78 | HR 68 | Ht 68.5 in | Wt 183.0 lb

## 2020-02-01 DIAGNOSIS — I2510 Atherosclerotic heart disease of native coronary artery without angina pectoris: Secondary | ICD-10-CM

## 2020-02-01 DIAGNOSIS — E785 Hyperlipidemia, unspecified: Secondary | ICD-10-CM | POA: Diagnosis not present

## 2020-02-01 DIAGNOSIS — I10 Essential (primary) hypertension: Secondary | ICD-10-CM

## 2020-02-01 DIAGNOSIS — I712 Thoracic aortic aneurysm, without rupture, unspecified: Secondary | ICD-10-CM

## 2020-02-01 DIAGNOSIS — I2584 Coronary atherosclerosis due to calcified coronary lesion: Secondary | ICD-10-CM

## 2020-02-01 MED ORDER — CARVEDILOL 12.5 MG PO TABS: 12.5000 mg | ORAL_TABLET | Freq: Two times a day (BID) | ORAL | 3 refills | Status: DC

## 2020-02-01 NOTE — Patient Instructions (Signed)
Medication Instructions:  START CARVEDILOL 12.5 MG TWICE A DAY   *If you need a refill on your cardiac medications before your next appointment, please call your pharmacy*  Lab Work: NONE  Testing/Procedures: Your physician has requested that you have an echocardiogram. Echocardiography is a painless test that uses sound waves to create images of your heart. It provides your doctor with information about the size and shape of your heart and how well your heart's chambers and valves are working. This procedure takes approximately one hour. There are no restrictions for this procedure. [place] [address] WHEN YOU RETURN TO TOWN   Follow-Up: At [facility], you and your health needs are our priority.  As part of our continuing mission to provide you with exceptional heart care, we have created designated Provider Care Teams.  These Care Teams include your primary Cardiologist (physician) and Advanced Practice Providers (APPs -  Physician Assistants and Nurse Practitioners) who all work together to provide you with the care you need, when you need it.  We recommend signing up for the patient portal called "MyChart".  Sign up information is provided on this After Visit Summary.  MyChart is used to connect with patients for Virtual Visits (Telemedicine).  Patients are able to view lab/test results, encounter notes, upcoming appointments, etc.  Non-urgent messages can be sent to your provider as well.   To learn more about what you can do with MyChart, go to [email or web address].    Your next appointment:   1 month(s)  The format for your next appointment:   Virtual Visit   Provider:   You may see Skeet Latch, MD or one of the following Advanced Practice Providers on your designated Care Team:    Kerin Ransom, PA-C  [place], [place]  Coletta Memos, [place]  Other Instructions MONITOR AND LOG YOUR READINGS, HAVE AVAILABLE AT [place] 1  MONTH

## 2020-02-01 NOTE — Progress Notes (Signed)
Cardiology Office Note   Date:  02/01/2020   ID:  Alexandra Mosqueda, DOB 1935-05-31, MRN [id number]  PCP:  Elita Boone, MD  Cardiologist:   Skeet Latch, MD   No chief complaint on file.    Patient ID: Chris Navarro is an 85 y.o. male with prior stroke, bradycardia, moderate ascending aortic aneurysm, and moderate aortic regurgitation who presents for follow up.  Mr. Enter was first seen in 09/2015.  He had an episode of syncope while on a transcontinental flight 08/2015.   He was seen at [facility] where his workup was reportedly unremarkable. The event was thought to be due to intravascular volume depletion. He had a CT scan that was negative for pulmonary embolism, but did show some coronary calcifications. It also showed an ascending thoracic aortic aneurysm of 4.3 cm. He was told to follow-up with cardiologist.  Mr. Freni also had a Holter monitor placed that revealed mostly sinus rhythm with 34 minutes of bradycardia with heart rates in the upper 40s. In addition it revealed a 16 beat run of supraventricular tachycardia.  He denies any symptoms while wearing the monitor and thinks that he was up and getting dressed during the episode of bradycardia.   Mr. Kiehl was referred for an exercise Cardiolite 09/2015.  He exercised for 6 minutes and there were no perfusion defects. LVEF was 48%.  He subsequently had an echo done in [place] with LVEF 55-60%.  There was moderate LA enlargement, mild mitral regurgitation, and moderate aortic regurgitation.  He was noted to have a mild ascending aortic aneurysm measuring 4.3 cm.  Mr. Malonzo had a repeat echo 02/26/17 that revealed LVEF 60-65% with mild to moderate aortic regurgitation and a mild ascending aortic aneurysm that was unchanged in size.  However his last echo 03/2019 showed that his LVEF was labile but aneurysm has increased to 4.6 cm.  Since his last appointment Mr. Maysonet is doing well.  At the last appointment his blood pressure was running  a little high but he wanted to work on diet and exercise.  Since then his blood pressure still running in the 140s over 70s to 80s.  Overall he has been feeling quite well.  He notes that he has not been as active lately due to the weather.  He wants to start back walking soon.  He does like to golf.  He has no exertional chest pain or shortness of breath.  He denies lower extremity edema, orthopnea, or PND.   Past Medical History:  Diagnosis Date  . Coronary artery calcification 09/19/2015  . Essential hypertension 09/19/2015  . Hyperlipidemia 02/11/2016  . Prostate cancer ([place])   . Skin cancer melanoma  . Stroke (cerebrum) ([place]) 09/19/2015  . Stroke [facility]) mini stroke  . Syncope 09/19/2015  . Thoracic aortic aneurysm ([place]) 09/19/2015   Descending; 4.3 cm 08/2015 on CT scan    Past Surgical History:  Procedure Laterality Date  . HERNIA REPAIR  2005  . [place]  . MELANOMA EXCISION  1994     Current Outpatient Medications  Medication Sig Dispense Refill  . aspirin 81 MG tablet Take 81 mg by mouth daily.    Marland Kitchen losartan (COZAAR) 50 MG tablet TAKE 1 TABLET (50 MG TOTAL) BY MOUTH DAILY. CONTACT OUR OFFICE FOR AN APPOINTMENT 90 tablet 2  . Magnesium 500 MG TABS Take 500 mg by mouth daily.    . rosuvastatin (CRESTOR) 10 MG tablet TAKE 1 TABLET BY  MOUTH EVERY DAY 90 tablet 6  . carvedilol (COREG) 12.5 MG tablet Take 1 tablet (12.5 mg total) by mouth 2 (two) times daily. 180 tablet 3   No current facility-administered medications for this visit.    Allergies:   Atorvastatin    Social History:  The patient  reports that he quit smoking about 54 years ago. He has never used smokeless tobacco. He reports current alcohol use of about 1.0 - 2.0 standard drinks of alcohol per week.   Family History:  The patient's family history includes Angina in his brother; Cancer in his sister; Diabetes in his brother; Stroke in his father.    ROS:  Please see the history of present  illness.   Otherwise, review of systems are positive for numbness in the right leg and foot..   All other systems are reviewed and negative.    PHYSICAL EXAM: VS:  BP (!) 146/78   Pulse 68   Ht 5' 8.5" (1.74 m)   Wt 183 lb (83 kg)   SpO2 95%   BMI 27.42 kg/m  , BMI Body mass index is 27.42 kg/m. GENERAL:  Well appearing HEENT: Pupils equal round and reactive, fundi not visualized, oral mucosa unremarkable NECK:  No jugular venous distention, waveform within normal limits, carotid upstroke brisk and symmetric, no bruits LUNGS:  Clear to auscultation bilaterally HEART:  RRR.  PMI not displaced or sustained,S1 and S2 within normal limits, no S3, no S4, no clicks, no rubs, II/IV diastolic murmur at the LUSB ABD:  Flat, positive bowel sounds normal in frequency in pitch, no bruits, no rebound, no guarding, no midline pulsatile mass, no hepatomegaly, no splenomegaly EXT:  2 plus pulses throughout, mild R LE edema, no cyanosis no clubbing SKIN:  No rashes no nodules NEURO:  Cranial nerves II through XII grossly intact, motor grossly intact throughout PSYCH:  Cognitively intact, oriented to person place and time   EKG:  EKG is ordered today. The ekg ordered and 10/02/15 demonstrates sinus rhythm rate 76 bpm. 02/26/17: Sinus rhythm. Rate 62 bpm. 06/28/18: Sinus bradycardia.  Rate 53 bpm.   02/01/2020: Sinus rhythm.  Rate 68 bpm.  Echo 02/26/17: Study Conclusions  - Left ventricle: The cavity size was normal. There was mild   concentric hypertrophy. Systolic function was normal. The   estimated ejection fraction was in the range of 60% to 65%. Wall   motion was normal; there were no regional wall motion   abnormalities. Doppler parameters are consistent with abnormal   left ventricular relaxation (grade 1 diastolic dysfunction).   Doppler parameters are consistent with indeterminate ventricular   filling pressure. - Aortic valve: Transvalvular velocity was within the normal range.    There was no stenosis. There was moderate regurgitation.   Regurgitation pressure half-time: 398 ms. - Aorta: Ascending aortic diameter: 42.6 mm (S). - Ascending aorta: The ascending aorta was mildly dilated. - Mitral valve: Transvalvular velocity was within the normal range.   There was no evidence for stenosis. There was mild regurgitation. - Left atrium: The atrium was mildly dilated. - Right ventricle: The cavity size was mildly dilated. Wall   thickness was normal. Systolic function was normal. - Tricuspid valve: There was trivial regurgitation.  Echo 03/28/19: IMPRESSIONS    1. The left ventricle has normal systolic function, with an ejection  fraction of 55-60%. The cavity size was mildly dilated. Left ventricular  diastolic Doppler parameters are consistent with impaired relaxation.  2. The right ventricle has normal systolic  function. The cavity was  normal.  3. The mitral valve is abnormal. Mild thickening of the mitral valve  leaflet.  4. The tricuspid valve is grossly normal.  5. The aortic valve is tricuspid. Mild thickening of the aortic valve.  Aortic valve regurgitation is mild by color flow Doppler. No stenosis of  the aortic valve.  6. Aneurysm of the ascending aorta, measuring 46 mm.  7. The interatrial septum is aneurysmal.  8. Normal LV systolic function; mild LVE; mild diastolic dysfunction;  ascending aortic aneurysm (4.6 cm); mild AI and MR.   Recent Labs: No results found for requested labs within last 8760 hours.    Lipid Panel    Component Value Date/Time   CHOL 124 02/26/2017 1410   TRIG 64 02/26/2017 1410   HDL 54 02/26/2017 1410   CHOLHDL 2.3 02/26/2017 1410   VLDL 13 02/26/2017 1410   LDLCALC 57 02/26/2017 1410      Wt Readings from Last 3 Encounters:  02/01/20 183 lb (83 kg)  03/31/19 182 lb (82.6 kg)  06/28/18 179 lb 6.4 oz (81.4 kg)      ASSESSMENT AND PLAN:   # Aortic regurgitation: Mild aortic regurgitation.  He is  asymptomatic.  # Asymptomatic coronary calcification:  # Hyperlipidemia:  Goal LDL is less than 70. No perfusion deficits were noted on stress testing. Continue aspirin and atorvastatin 80 mg daily.  LDL was 62 on 03/2017.  # Hypertension:  Blood pressure remains above goal.  Continue losartan and we will add carvedilol 12.5 mg twice daily.  He will continue to track his blood pressure and will discuss at follow-up.  # Ascending aortic aneurysm: Increased from 4.3 centimeters to 4.6 cm.  We will repeat his echocardiogram.  We also discussed the importance of blood pressure control.  Adding beta-blocker as above.  # Obesity:  Mr. Arave has a goal of losing 10 pounds by his next appointment.   Current medicines are reviewed at length with the patient today.  The patient does not have concerns regarding medicines.  The following changes have been made: Add carvedilol  Labs/ tests ordered today include:   Orders Placed This Encounter  Procedures  . EKG 12-Lead     Disposition:   FU with Idalys Konecny C. Oval Linsey, MD in 03/2019.   Signed, Skeet Latch, MD  02/01/2020 3:01 PM    [place]

## 2020-02-01 NOTE — Addendum Note (Signed)
Addended by: Alvina Filbert B on: 02/01/2020 03:16 PM   Modules accepted: Orders

## 2020-03-18 ENCOUNTER — Telehealth: Payer: Self-pay | Admitting: Cardiovascular Disease

## 2020-03-18 NOTE — Telephone Encounter (Signed)
New message   Patient's wife states that he has already had an echo done and it was sent to Dr. Blenda Mounts office. The echo was done at [facility]. Please call to discuss.

## 2020-03-18 NOTE — Telephone Encounter (Signed)
Spoke with wife and she stated he did not have done, will keep appointment as scheduled

## 2020-03-21 ENCOUNTER — Ambulatory Visit (HOSPITAL_COMMUNITY): Payer: Medicare Other | Attending: Cardiovascular Disease

## 2020-03-21 ENCOUNTER — Other Ambulatory Visit: Payer: Self-pay

## 2020-03-21 DIAGNOSIS — I359 Nonrheumatic aortic valve disorder, unspecified: Secondary | ICD-10-CM | POA: Insufficient documentation

## 2020-03-28 ENCOUNTER — Encounter: Payer: Self-pay | Admitting: Cardiovascular Disease

## 2020-03-28 ENCOUNTER — Telehealth (INDEPENDENT_AMBULATORY_CARE_PROVIDER_SITE_OTHER): Payer: Medicare Other | Admitting: Cardiovascular Disease

## 2020-03-28 VITALS — BP 135/82 | HR 57 | Ht 68.5 in | Wt 182.0 lb

## 2020-03-28 DIAGNOSIS — I10 Essential (primary) hypertension: Secondary | ICD-10-CM

## 2020-03-28 DIAGNOSIS — I712 Thoracic aortic aneurysm, without rupture, unspecified: Secondary | ICD-10-CM

## 2020-03-28 DIAGNOSIS — Z5181 Encounter for therapeutic drug level monitoring: Secondary | ICD-10-CM

## 2020-03-28 DIAGNOSIS — I2584 Coronary atherosclerosis due to calcified coronary lesion: Secondary | ICD-10-CM

## 2020-03-28 DIAGNOSIS — I63019 Cerebral infarction due to thrombosis of unspecified vertebral artery: Secondary | ICD-10-CM

## 2020-03-28 DIAGNOSIS — E785 Hyperlipidemia, unspecified: Secondary | ICD-10-CM

## 2020-03-28 DIAGNOSIS — I2510 Atherosclerotic heart disease of native coronary artery without angina pectoris: Secondary | ICD-10-CM

## 2020-03-28 NOTE — Patient Instructions (Signed)
Medication Instructions:  Your physician recommends that you continue on your current medications as directed. Please refer to the Current Medication list given to you today.  *If you need a refill on your cardiac medications before your next appointment, please call your pharmacy*  Lab Work: FASTING LP/CMET SOON   If you have labs (blood work) drawn today and your tests are completely normal, you will receive your results only by: . MyChart Message (if you have MyChart) OR . A paper copy in the mail If you have any lab test that is abnormal or we need to change your treatment, we will call you to review the results.   Testing/Procedures: NONE  Follow-Up: At [facility], you and your health needs are our priority.  As part of our continuing mission to provide you with exceptional heart care, we have created designated Provider Care Teams.  These Care Teams include your primary Cardiologist (physician) and Advanced Practice Providers (APPs -  Physician Assistants and Nurse Practitioners) who all work together to provide you with the care you need, when you need it.  We recommend signing up for the patient portal called "MyChart".  Sign up information is provided on this After Visit Summary.  MyChart is used to connect with patients for Virtual Visits (Telemedicine).  Patients are able to view lab/test results, encounter notes, upcoming appointments, etc.  Non-urgent messages can be sent to your provider as well.   To learn more about what you can do with MyChart, go to [email or web address].    Your next appointment:   12 month(s) You will receive a reminder letter in the mail two months in advance. If you don't receive a letter, please call our office to schedule the follow-up appointment.  The format for your next appointment:   In Person  Provider:   You may see Tiffany [place], MD or one of the following Advanced Practice Providers on your designated Care Team:    Luke  Kilroy, PA-C  Callie Goodrich, PA-C  Jesse Cleaver, FNP    

## 2020-03-28 NOTE — Progress Notes (Signed)
Virtual Visit via Telephone Note   This visit type was conducted due to national recommendations for restrictions regarding the COVID-19 Pandemic (e.g. social distancing) in an effort to limit this patient's exposure and mitigate transmission in our community.  Due to his co-morbid illnesses, this patient is at least at moderate risk for complications without adequate follow up.  This format is felt to be most appropriate for this patient at this time.  The patient did not have access to video technology/had technical difficulties with video requiring transitioning to audio format only (telephone).  All issues noted in this document were discussed and addressed.  No physical exam could be performed with this format.  Please refer to the patient's chart for his  consent to telehealth for [facility].   The patient was identified using 2 identifiers.  Date:  03/28/2020   ID:  [name], DOB [birth year], MRN [id number]  Patient Location: Home Provider Location: Home  PCP:  Elita Boone, MD  Cardiologist:  Skeet Latch, MD  Electrophysiologist:  None   Evaluation Performed:  Follow-Up Visit  Chief Complaint:  hypertension  History of Present Illness:    Lay Gehres is an 85 y.o. male with prior stroke, bradycardia, moderate ascending aortic aneurysm, and moderate aortic regurgitation who presents for follow up.  Mr. Schoepp was first seen in 09/2015.  He had an episode of syncope while on a transcontinental flight 08/2015.   He was seen at [facility] where his workup was reportedly unremarkable. The event was thought to be due to intravascular volume depletion. He had a CT scan that was negative for pulmonary embolism, but did show some coronary calcifications. It also showed an ascending thoracic aortic aneurysm of 4.3 cm. He was told to follow-up with cardiologist.  Mr. Egnew also had a Holter monitor placed that revealed mostly sinus rhythm with 34 minutes of bradycardia with  heart rates in the upper 40s. In addition it revealed a 16 beat run of supraventricular tachycardia.  He denies any symptoms while wearing the monitor and thinks that he was up and getting dressed during the episode of bradycardia.   Mr. Weyker was referred for an exercise Cardiolite 09/2015.  He exercised for 6 minutes and there were no perfusion defects. LVEF was 48%.  He subsequently had an echo done in [place] with LVEF 55-60%.  There was moderate LA enlargement, mild mitral regurgitation, and moderate aortic regurgitation.  He was noted to have a mild ascending aortic aneurysm measuring 4.3 cm.  Mr. Gayheart had a repeat echo 02/26/17 that revealed LVEF 60-65% with mild to moderate aortic regurgitation and a mild ascending aortic aneurysm that was unchanged in size.  However his last echo 03/2019 showed that his LVEF was labile but aneurysm has increased to 4.6 cm.  At his last appointment Mr. Rawdon's blood pressure was running high.  His aortic aneurysm was also increasing.  Carvedilol was added.  He had a repeat echocardiogram 03/2020 that showed a ascending aortic aneurysm was still 4.6 cm.  Since adding carvedilol but started having lightheadedness and fatigue.  The symptoms improved after sitting.  He also had severe lower back aches.  The episodes improved after 1-2 hours after taking the medication.  He switched to taking it early AM and late and night and this seems to have helped.  He has better energy levels and his interest in golf has improved.  He has no exertional chest pain.    Past Medical History:  Diagnosis  Date  . Coronary artery calcification 09/19/2015  . Essential hypertension 09/19/2015  . Hyperlipidemia 02/11/2016  . Prostate cancer ([place])   . Skin cancer melanoma  . Stroke (cerebrum) ([place]) 09/19/2015  . Stroke [facility]) mini stroke  . Syncope 09/19/2015  . Thoracic aortic aneurysm ([place]) 09/19/2015   Descending; 4.3 cm 08/2015 on CT scan    Past Surgical History:  Procedure Laterality  Date  . HERNIA REPAIR  2005  . [place]  . MELANOMA EXCISION  1994     Current Outpatient Medications  Medication Sig Dispense Refill  . aspirin 81 MG tablet Take 81 mg by mouth daily.    . carvedilol (COREG) 12.5 MG tablet Take 1 tablet (12.5 mg total) by mouth 2 (two) times daily. 180 tablet 3  . losartan (COZAAR) 50 MG tablet TAKE 1 TABLET (50 MG TOTAL) BY MOUTH DAILY. CONTACT OUR OFFICE FOR AN APPOINTMENT 90 tablet 2  . Magnesium 500 MG TABS Take 500 mg by mouth daily.    . rosuvastatin (CRESTOR) 10 MG tablet TAKE 1 TABLET BY MOUTH EVERY DAY 90 tablet 6   No current facility-administered medications for this visit.    Allergies:   Atorvastatin    Social History:  The patient  reports that he quit smoking about 54 years ago. He has never used smokeless tobacco. He reports current alcohol use of about 1.0 - 2.0 standard drinks of alcohol per week.   Family History:  The patient's family history includes Angina in his brother; Cancer in his sister; Diabetes in his brother; Stroke in his father.    ROS:  Please see the history of present illness.   Otherwise, review of systems are positive for numbness in the right leg and foot..   All other systems are reviewed and negative.    PHYSICAL EXAM: BP 135/82   Pulse (!) 57   Ht 5' 8.5" (1.74 m)   Wt 182 lb (82.6 kg)   BMI 27.27 kg/m  GENERAL: Sounds well.  No acute distress. RESP: Respirations unlabored. NEURO:  Speech fluent.  Cranial nerves grossly intact.  Moves all 4 extremities freely PSYCH:  Cognitively intact, oriented to person place and time   EKG:  EKG is not ordered today. The ekg ordered and 10/02/15 demonstrates sinus rhythm rate 76 bpm. 02/26/17: Sinus rhythm. Rate 62 bpm. 06/28/18: Sinus bradycardia.  Rate 53 bpm.   02/01/2020: Sinus rhythm.  Rate 68 bpm.  Echo 03/28/19: IMPRESSIONS  1. The left ventricle has normal systolic function, with an ejection  fraction of 55-60%. The cavity size  was mildly dilated. Left ventricular  diastolic Doppler parameters are consistent with impaired relaxation.  2. The right ventricle has normal systolic function. The cavity was  normal.  3. The mitral valve is abnormal. Mild thickening of the mitral valve  leaflet.  4. The tricuspid valve is grossly normal.  5. The aortic valve is tricuspid. Mild thickening of the aortic valve.  Aortic valve regurgitation is mild by color flow Doppler. No stenosis of  the aortic valve.  6. Aneurysm of the ascending aorta, measuring 46 mm.  7. The interatrial septum is aneurysmal.  8. Normal LV systolic function; mild LVE; mild diastolic dysfunction;  ascending aortic aneurysm (4.6 cm); mild AI and MR.   Echo 5/21: 1. Left ventricular ejection fraction, by estimation, is 55 to 60%. The  left ventricle has normal function. The left ventricle has no regional  wall motion abnormalities. Left ventricular diastolic parameters  are  consistent with Grade I diastolic  dysfunction (impaired relaxation).  2. Right ventricular systolic function is normal. The right ventricular  size is normal. There is normal pulmonary artery systolic pressure.  3. Left atrial size was mildly dilated.  4. The mitral valve is normal in structure. Mild mitral valve  regurgitation. No evidence of mitral stenosis.  5. The aortic valve is normal in structure. Aortic valve regurgitation is  mild. No aortic stenosis is present. Aortic regurgitation PHT measures 702  msec.  6. Aortic dilatation noted. Aneurysm of the ascending aorta, measuring 46  mm. There is moderate dilatation of the aortic root measuring 44 mm.  7. The inferior vena cava is normal in size with greater than 50%  respiratory variability, suggesting right atrial pressure of 3 mmHg.    Recent Labs: No results found for requested labs within last 8760 hours.    Lipid Panel    Component Value Date/Time   CHOL 124 02/26/2017 1410   TRIG 64 02/26/2017  1410   HDL 54 02/26/2017 1410   CHOLHDL 2.3 02/26/2017 1410   VLDL 13 02/26/2017 1410   LDLCALC 57 02/26/2017 1410      Wt Readings from Last 3 Encounters:  03/28/20 182 lb (82.6 kg)  02/01/20 183 lb (83 kg)  03/31/19 182 lb (82.6 kg)      ASSESSMENT AND PLAN:  # Aortic regurgitation: Mild aortic regurgitation.  He remains asymptomatic.  # Asymptomatic coronary calcification:  # Hyperlipidemia:  Goal LDL is less than 70. No perfusion deficits were noted on stress testing. Continue aspirin and rosuvastatin.  He will have fasting lipids/CMP checked.  # Hypertension:  Blood pressure is improving after adding carvedilol.  He initially had fatigue and back pain but this seems to have improved.  Continue carvedilol and losartan.  # Ascending aortic aneurysm: Stable 4.6 cm.  Repeat echo in 1 year.  Continue carvedilol and blood pressure management as above.  COVID-19 Education: The signs and symptoms of COVID-19 were discussed with the patient and how to seek care for testing (follow up with PCP or arrange E-visit).  The importance of social distancing was discussed today.  Time:   Today, I have spent 22 minutes with the patient with telehealth technology discussing the above problems.     Current medicines are reviewed at length with the patient today.  The patient does not have concerns regarding medicines.  The following changes have been made: none  Labs/ tests ordered today include:   Orders Placed This Encounter  Procedures  . Lipid panel  . Comprehensive metabolic panel     Disposition:   FU with Rhyen Mazariego C. Oval Linsey, MD in one year.   Signed, Skeet Latch, MD  03/28/2020 10:51 AM    [place]

## 2020-04-13 ENCOUNTER — Other Ambulatory Visit: Payer: Self-pay | Admitting: Cardiovascular Disease

## 2020-04-15 ENCOUNTER — Other Ambulatory Visit: Payer: Self-pay | Admitting: Cardiovascular Disease

## 2020-04-17 NOTE — Telephone Encounter (Signed)
Rx request sent to pharmacy.  

## 2020-05-02 ENCOUNTER — Telehealth: Payer: Self-pay | Admitting: Cardiovascular Disease

## 2020-05-02 MED ORDER — CARVEDILOL 12.5 MG PO TABS: 12.5000 mg | ORAL_TABLET | Freq: Two times a day (BID) | ORAL | 3 refills | Status: DC

## 2020-05-02 NOTE — Telephone Encounter (Signed)
Refill request done via epic ./cy

## 2020-05-02 NOTE — Telephone Encounter (Signed)
New message   *STAT* If patient is at the pharmacy, call can be transferred to refill team.   1. Which medications need to be refilled? (please list name of each medication and dose if known) carvedilol (COREG) 12.5 MG tablet(Expired)  2. Which pharmacy/location (including street and city if local pharmacy) is medication to be sent to?[id number] [place], [place] - [address]  3. Do they need a 30 day or 90 day supply? [place]

## 2020-05-16 ENCOUNTER — Telehealth: Payer: Self-pay | Admitting: Cardiovascular Disease

## 2020-05-16 NOTE — Telephone Encounter (Signed)
Patients wife, Deneise Lever, called to see if we received a fax on labs that were done in [place] on 6/17. Couldn't find anything in chart stating labs were faxed to [place], transferred call to medical records.

## 2020-06-11 ENCOUNTER — Telehealth: Payer: Self-pay | Admitting: Cardiovascular Disease

## 2020-06-11 NOTE — Telephone Encounter (Signed)
Patient said he had labs done at [facility] 04/25/20. He has not heard anything about the results of those labs from Dr. Oval Linsey yet. He wanted to know if the office had gotten the results yet. He states the office gave him the fax number of [phone]. Advised the patient to send results to [phone]

## 2020-06-11 NOTE — Telephone Encounter (Signed)
I do not see in the chart

## 2020-07-10 ENCOUNTER — Telehealth: Payer: Self-pay | Admitting: Cardiovascular Disease

## 2020-07-10 NOTE — Telephone Encounter (Signed)
Spoke with patient and he does not think he has ever taken Simvastatin/Zocor before Will forward to Pharm D for review

## 2020-07-10 NOTE — Telephone Encounter (Signed)
Anderson Malta with [facility] is requesting to assist with switching medications from rosuvastatin (CRESTOR) 10 MG tablet to Simvastatin. Anderson Malta states if the patient takes Simvastatin instead it will only cost $10.00.

## 2020-07-10 NOTE — Telephone Encounter (Signed)
Spoke with patient - he is fine staying with rosuvastatin.  Has no concern for cost at this time.  Previously had elevated LFT's when took atorvastatin, but has done well with rosuvastatin.    Pharmacy notified.  Pt to stay on rosuvastatin for now

## 2020-07-16 NOTE — Telephone Encounter (Signed)
Labs received, reviewed by Dr Oval Linsey, and mychart message to patient

## 2020-10-23 ENCOUNTER — Other Ambulatory Visit: Payer: Self-pay | Admitting: *Deleted

## 2020-10-23 DIAGNOSIS — I712 Thoracic aortic aneurysm, without rupture, unspecified: Secondary | ICD-10-CM

## 2020-10-23 DIAGNOSIS — I359 Nonrheumatic aortic valve disorder, unspecified: Secondary | ICD-10-CM

## 2021-01-03 ENCOUNTER — Other Ambulatory Visit: Payer: Self-pay | Admitting: Cardiovascular Disease

## 2021-03-18 ENCOUNTER — Encounter: Payer: Self-pay | Admitting: Cardiovascular Disease

## 2021-03-31 ENCOUNTER — Ambulatory Visit: Payer: Medicare Other | Admitting: Cardiovascular Disease

## 2021-03-31 ENCOUNTER — Other Ambulatory Visit (HOSPITAL_COMMUNITY): Payer: Medicare Other

## 2021-04-04 ENCOUNTER — Other Ambulatory Visit (HOSPITAL_BASED_OUTPATIENT_CLINIC_OR_DEPARTMENT_OTHER): Payer: Self-pay | Admitting: Cardiovascular Disease

## 2021-04-20 ENCOUNTER — Other Ambulatory Visit (HOSPITAL_BASED_OUTPATIENT_CLINIC_OR_DEPARTMENT_OTHER): Payer: Self-pay | Admitting: Cardiovascular Disease

## 2021-05-22 ENCOUNTER — Other Ambulatory Visit (HOSPITAL_COMMUNITY): Payer: Medicare Other

## 2021-05-22 ENCOUNTER — Ambulatory Visit (HOSPITAL_BASED_OUTPATIENT_CLINIC_OR_DEPARTMENT_OTHER): Payer: Medicare Other | Admitting: Cardiovascular Disease

## 2021-07-07 ENCOUNTER — Ambulatory Visit (HOSPITAL_BASED_OUTPATIENT_CLINIC_OR_DEPARTMENT_OTHER): Payer: Medicare Other | Admitting: Cardiovascular Disease

## 2021-07-11 ENCOUNTER — Other Ambulatory Visit: Payer: Self-pay

## 2021-07-11 ENCOUNTER — Encounter (HOSPITAL_BASED_OUTPATIENT_CLINIC_OR_DEPARTMENT_OTHER): Payer: Self-pay | Admitting: Cardiovascular Disease

## 2021-07-11 ENCOUNTER — Ambulatory Visit (HOSPITAL_COMMUNITY): Payer: Medicare Other | Attending: Cardiology

## 2021-07-11 ENCOUNTER — Ambulatory Visit (INDEPENDENT_AMBULATORY_CARE_PROVIDER_SITE_OTHER): Payer: Medicare Other | Admitting: Cardiovascular Disease

## 2021-07-11 VITALS — BP 160/82 | HR 60 | Ht 69.0 in | Wt 175.2 lb

## 2021-07-11 DIAGNOSIS — Z5181 Encounter for therapeutic drug level monitoring: Secondary | ICD-10-CM

## 2021-07-11 DIAGNOSIS — I10 Essential (primary) hypertension: Secondary | ICD-10-CM | POA: Diagnosis not present

## 2021-07-11 DIAGNOSIS — I712 Thoracic aortic aneurysm, without rupture, unspecified: Secondary | ICD-10-CM

## 2021-07-11 DIAGNOSIS — E785 Hyperlipidemia, unspecified: Secondary | ICD-10-CM | POA: Diagnosis not present

## 2021-07-11 DIAGNOSIS — I2584 Coronary atherosclerosis due to calcified coronary lesion: Secondary | ICD-10-CM | POA: Diagnosis not present

## 2021-07-11 DIAGNOSIS — I359 Nonrheumatic aortic valve disorder, unspecified: Secondary | ICD-10-CM | POA: Diagnosis present

## 2021-07-11 DIAGNOSIS — I2510 Atherosclerotic heart disease of native coronary artery without angina pectoris: Secondary | ICD-10-CM

## 2021-07-11 LAB — ECHOCARDIOGRAM COMPLETE
Area-P 1/2: 2.37 cm2
Height: 69 in
P 1/2 time: 460 msec
S' Lateral: 3.9 cm
Weight: 2803.2 oz

## 2021-07-11 MED ORDER — ROSUVASTATIN CALCIUM 10 MG PO TABS: 10.0000 mg | ORAL_TABLET | Freq: Every day | ORAL | 3 refills | Status: DC

## 2021-07-11 MED ORDER — NEBIVOLOL HCL 20 MG PO TABS: 20.0000 mg | ORAL_TABLET | Freq: Every day | ORAL | 3 refills | Status: DC

## 2021-07-11 NOTE — Progress Notes (Signed)
Cardiology Office Note   Date:  07/11/2021   ID:  Chris Navarro, DOB [birth year], MRN [id number]  PCP:  Howell Rucks, PA  Cardiologist:  Skeet Latch, MD  Electrophysiologist:  None   Evaluation Performed:  Follow-Up Visit  Chief Complaint:  hypertension  History of Present Illness:    Chris Navarro is an 86 y.o. male with prior stroke, bradycardia, moderate ascending aortic aneurysm, coronary calcification and moderate aortic regurgitation who presents for follow up.  Chris Navarro was first seen in 09/2015.  Chris Navarro had an episode of syncope while on a transcontinental flight 08/2015.   Chris Navarro was seen at [facility] where his workup was reportedly unremarkable. Chris event was thought to be due to intravascular volume depletion. Chris Navarro had a CT scan that was negative for pulmonary embolism, but did show some coronary calcifications. It also showed an ascending thoracic aortic aneurysm of 4.3 cm. Chris Navarro was told to follow-up with cardiologist.  Chris Navarro also had a Holter monitor placed that revealed mostly sinus rhythm with 34 minutes of bradycardia with heart rates in Chris upper 40s. In addition it revealed a 16 beat run of supraventricular tachycardia.  Chris Navarro denied any symptoms while wearing Chris monitor and believed that Chris Navarro was up and getting dressed during Chris episode of bradycardia.   Chris Navarro was referred for an exercise Cardiolite 09/2015.  Chris Navarro exercised for 6 minutes and there were no perfusion defects. LVEF was 48%.  Chris Navarro subsequently had an echo done in [place] with LVEF 55-60%.  There was moderate LA enlargement, mild mitral regurgitation, and moderate aortic regurgitation.  Chris Navarro was noted to have a mild ascending aortic aneurysm measuring 4.3 cm.  Chris Navarro had a repeat echo 02/26/17 that revealed LVEF 60-65% with mild to moderate aortic regurgitation and a mild ascending aortic aneurysm that was unchanged in size.  However his last echo 03/2019 showed that his LVEF was labile but aneurysm has increased to 4.6  cm.  Chris Navarro's blood pressure was running high.  His aortic aneurysm was also increasing, so carvedilol was added.  Chris Navarro had a repeat echocardiogram 03/2020 that showed a ascending aortic aneurysm was still 4.6 cm.  Since adding carvedilol Chris Navarro started having lightheadedness and fatigue.  Chris symptoms improved after sitting. Today, Chris Navarro is doing pretty good and has lost some weight. Chris Navarro attributes this to being infected with COVID 05/2021, as Chris Navarro lost his energy and appetite. At home his blood pressure has been averaging in Chris 100-120s/60-70s. Chris Navarro endorses some LE edema. For exercise Chris Navarro plays golf three times a week, and walks occasionally. Chris Navarro also participates in an exercise routine 3-4 times a week. In Chris morning, Carvedilol is causing a side effect of fatigue and a "woozy" feeling for an hour before wearing off. Chris Navarro denies any palpitations, chest pain, or shortness of breath. No headaches, syncope, orthopnea, or PND. Also has no exertional symptoms. Chris Navarro is not fasting today.   Past Medical History:  Diagnosis Date   Coronary artery calcification 09/19/2015   Essential hypertension 09/19/2015   Hyperlipidemia 02/11/2016   Prostate cancer ([place])    Skin cancer melanoma   Stroke (cerebrum) ([place]) 09/19/2015   Stroke ([place]) mini stroke   Syncope 09/19/2015   Thoracic aortic aneurysm ([place]) 09/19/2015   Descending; 4.3 cm 08/2015 on CT scan    Past Surgical History:  Procedure Laterality Date   HERNIA REPAIR  2005   [place]  Current Outpatient Medications  Medication Sig Dispense Refill   aspirin 81 MG tablet Take 81 mg by mouth daily.     losartan (COZAAR) 50 MG tablet TAKE 1 TABLET (50 MG TOTAL) BY MOUTH DAILY. CONTACT OUR OFFICE FOR AN APPOINTMENT 90 tablet 2   Magnesium 500 MG TABS Take 500 mg by mouth daily.     Nebivolol HCl 20 MG TABS Take 1 tablet (20 mg total) by mouth daily. 90 tablet 3   rosuvastatin (CRESTOR) 10 MG tablet Take 1 tablet (10 mg  total) by mouth daily. 90 tablet 3   No current facility-administered medications for this visit.    Allergies:   Atorvastatin    Social History:  Chris Navarro  reports that Chris Navarro quit smoking about 55 years ago. Chris Navarro has never used smokeless tobacco. Chris Navarro reports current alcohol use of about 1.0 - 2.0 standard drink per week.   Family History:  Chris Navarro's family history includes Angina in his brother; Cancer in his sister; Diabetes in his brother; Stroke in his father.    ROS:   Please see Chris history of present illness. (+) Fatigue (+) Lightheadedness (+) Loss of appetite All other systems are reviewed and negative.    PHYSICAL EXAM: VS:  BP (!) 160/82 (BP Location: Right Arm, Navarro Position: Sitting)   Pulse 60   Ht '5\' 9"'$  (1.753 m)   Wt 175 lb 3.2 oz (79.5 kg)   BMI 25.87 kg/m  , BMI Body mass index is 25.87 kg/m. GENERAL:  Well appearing HEENT: Pupils equal round and reactive, fundi not visualized, oral mucosa unremarkable NECK:  No jugular venous distention, waveform within normal limits, carotid upstroke brisk and symmetric, no bruits, no thyromegaly LYMPHATICS:  No cervical adenopathy LUNGS:  Clear to auscultation bilaterally HEART:  RRR.  PMI not displaced or sustained,S1 and S2 within normal limits, no S3, no S4, no clicks, no rubs, no murmurs ABD:  Flat, positive bowel sounds normal in frequency in pitch, no bruits, no rebound, no guarding, no midline pulsatile mass, no hepatomegaly, no splenomegaly EXT:  2 plus pulses throughout, no edema, no cyanosis no clubbing SKIN:  No rashes no nodules NEURO:  Cranial nerves II through XII grossly intact, motor grossly intact throughout PSYCH:  Cognitively intact, oriented to person place and time   EKG:   07/11/2021: Sinus rhythm. Rate 60 bpm. 02/01/2020: Sinus rhythm.  Rate 68 bpm. 06/28/18: Sinus bradycardia.  Rate 53 bpm.   02/26/17: Sinus rhythm. Rate 62 bpm. 10/02/15: sinus rhythm rate 76 bpm.  Echo 03/2020: 1. Left  ventricular ejection fraction, by estimation, is 55 to 60%. Chris  left ventricle has normal function. Chris left ventricle has no regional  wall motion abnormalities. Left ventricular diastolic parameters are  consistent with Grade I diastolic  dysfunction (impaired relaxation).   2. Right ventricular systolic function is normal. Chris right ventricular  size is normal. There is normal pulmonary artery systolic pressure.   3. Left atrial size was mildly dilated.   4. Chris mitral valve is normal in structure. Mild mitral valve  regurgitation. No evidence of mitral stenosis.   5. Chris aortic valve is normal in structure. Aortic valve regurgitation is  mild. No aortic stenosis is present. Aortic regurgitation PHT measures 702  msec.   6. Aortic dilatation noted. Aneurysm of Chris ascending aorta, measuring 46  mm. There is moderate dilatation of Chris aortic root measuring 44 mm.   7. Chris inferior vena cava is normal in size with greater  than 50%  respiratory variability, suggesting right atrial pressure of 3 mmHg.    Echo 03/28/19: IMPRESSIONS   1. Chris left ventricle has normal systolic function, with an ejection  fraction of 55-60%. Chris cavity size was mildly dilated. Left ventricular  diastolic Doppler parameters are consistent with impaired relaxation.   2. Chris right ventricle has normal systolic function. Chris cavity was  normal.   3. Chris mitral valve is abnormal. Mild thickening of Chris mitral valve  leaflet.   4. Chris tricuspid valve is grossly normal.   5. Chris aortic valve is tricuspid. Mild thickening of Chris aortic valve.  Aortic valve regurgitation is mild by color flow Doppler. No stenosis of  Chris aortic valve.   6. Aneurysm of Chris ascending aorta, measuring 46 mm.   7. Chris interatrial septum is aneurysmal.   8. Normal LV systolic function; mild LVE; mild diastolic dysfunction;  ascending aortic aneurysm (4.6 cm); mild AI and MR.    Recent Labs: No results found for requested labs  within last 8760 hours.    Lipid Panel    Component Value Date/Time   CHOL 124 02/26/2017 1410   TRIG 64 02/26/2017 1410   HDL 54 02/26/2017 1410   CHOLHDL 2.3 02/26/2017 1410   VLDL 13 02/26/2017 1410   LDLCALC 57 02/26/2017 1410      Wt Readings from Last 3 Encounters:  07/11/21 175 lb 3.2 oz (79.5 kg)  03/28/20 182 lb (82.6 kg)  02/01/20 183 lb (83 kg)      ASSESSMENT AND PLAN: Coronary artery calcification Chris Navarro is asymptomatic.  Continue aspirin and rosuvastatin.  Chris Navarro will come back for fasting lipids and a CMP.  Chris Navarro is having fatigue on carvedilol so we will switch to nebivolol 20 mg daily.  Thoracic aortic aneurysm [facility]) Chris Navarro is going for an echocardiogram today.  Blood pressure was elevated today but has been controlled at home.  Chris Navarro is not tolerating carvedilol and metoprolol not sufficient to control his blood pressure.  We will switch to nebivolol 20 mg daily.  Essential hypertension Blood pressure poorly controlled today but has been well-controlled at home.  Switching carvedilol to nebivolol to help with his side effects of fatigue and sleepiness.  Continue losartan.  His records at home show that his blood pressures been very well-controlled.  Chris Navarro will keep tracking it and bring his machine to follow-up with our pharmacist in about a month.     Current medicines are reviewed at length with Chris Navarro today.  Chris Navarro does not have concerns regarding medicines.  Chris following changes have been made: none  Labs/ tests ordered today include:   Orders Placed This Encounter  Procedures   Lipid panel   Comprehensive metabolic panel   AMB Referral to [facility] Pharm-D   EKG 12-Lead      Disposition:    FU with PharmD in 1 month. FU with Halena Mohar C. Oval Linsey, MD in 02/2022.  I,Mathew Stumpf,acting as a Education administrator for Skeet Latch, MD.,have documented all relevant documentation on Chris behalf of Skeet Latch, MD,as directed by  Skeet Latch, MD while in Chris  presence of Skeet Latch, MD.  I, [place] Oval Linsey, MD have reviewed all documentation for this visit.  Chris documentation of Chris exam, diagnosis, procedures, and orders on 07/11/2021 are all accurate and complete.   Signed, Skeet Latch, MD  07/11/2021 12:38 PM    [facility]

## 2021-07-11 NOTE — Assessment & Plan Note (Signed)
He is asymptomatic.  Continue aspirin and rosuvastatin.  He will come back for fasting lipids and a CMP.  He is having fatigue on carvedilol so we will switch to nebivolol 20 mg daily.

## 2021-07-11 NOTE — Assessment & Plan Note (Signed)
He is going for an echocardiogram today.  Blood pressure was elevated today but has been controlled at home.  He is not tolerating carvedilol and metoprolol not sufficient to control his blood pressure.  We will switch to nebivolol 20 mg daily.

## 2021-07-11 NOTE — Assessment & Plan Note (Signed)
Blood pressure poorly controlled today but has been well-controlled at home.  Switching carvedilol to nebivolol to help with his side effects of fatigue and sleepiness.  Continue losartan.  His records at home show that his blood pressures been very well-controlled.  He will keep tracking it and bring his machine to follow-up with our pharmacist in about a month.

## 2021-07-11 NOTE — Patient Instructions (Addendum)
Medication Instructions:  STOP CARVEDILOL   START NEBIVOLOL  20 MG DAILY   *If you need a refill on your cardiac medications before your next appointment, please call your pharmacy*  Lab Work FASTING LP/CMET SOON   If you have labs (blood work) drawn today and your tests are completely normal, you will receive your results only by: [place] (if you have MyChart) OR A paper copy in the mail If you have any lab test that is abnormal or we need to change your treatment, we will call you to review the results.  Testing/Procedures: NONE   Follow-Up: At [facility], you and your health needs are our priority.  As part of our continuing mission to provide you with exceptional heart care, we have created designated Provider Care Teams.  These Care Teams include your primary Cardiologist (physician) and Advanced Practice Providers (APPs -  Physician Assistants and Nurse Practitioners) who all work together to provide you with the care you need, when you need it.  We recommend signing up for the patient portal called "MyChart".  Sign up information is provided on this After Visit Summary.  MyChart is used to connect with patients for Virtual Visits (Telemedicine).  Patients are able to view lab/test results, encounter notes, upcoming appointments, etc.  Non-urgent messages can be sent to your provider as well.   To learn more about what you can do with MyChart, go to [email or web address].    Your next appointment:   IN April WITH DR [place] NP   Your physician recommends that you schedule a follow-up appointment in [place] AT [place]

## 2021-07-23 LAB — COMPREHENSIVE METABOLIC PANEL
ALT: 10 IU/L (ref 0–44)
AST: 16 IU/L (ref 0–40)
Albumin/Globulin Ratio: 1.5 (ref 1.2–2.2)
Albumin: 4.6 g/dL (ref 3.6–4.6)
Alkaline Phosphatase: 81 IU/L (ref 44–121)
BUN/Creatinine Ratio: 15 (ref 10–24)
BUN: 21 mg/dL (ref 8–27)
Bilirubin Total: 0.7 mg/dL (ref 0.0–1.2)
CO2: 24 mmol/L (ref 20–29)
Calcium: 9.4 mg/dL (ref 8.6–10.2)
Chloride: 105 mmol/L (ref 96–106)
Creatinine, Ser: 1.44 mg/dL — ABNORMAL HIGH (ref 0.76–1.27)
Globulin, Total: 3 g/dL (ref 1.5–4.5)
Glucose: 101 mg/dL — ABNORMAL HIGH (ref 65–99)
Potassium: 4.7 mmol/L (ref 3.5–5.2)
Sodium: 143 mmol/L (ref 134–144)
Total Protein: 7.6 g/dL (ref 6.0–8.5)
eGFR: 48 mL/min/{1.73_m2} — ABNORMAL LOW (ref >59)

## 2021-07-23 LAB — LIPID PANEL
Chol/HDL Ratio: 3 ratio (ref 0.0–5.0)
Cholesterol, Total: 164 mg/dL (ref 100–199)
HDL: 54 mg/dL (ref >39)
LDL Chol Calc (NIH): 88 mg/dL (ref 0–99)
Triglycerides: 124 mg/dL (ref 0–149)
VLDL Cholesterol Cal: 22 mg/dL (ref 5–40)

## 2021-08-05 ENCOUNTER — Telehealth (HOSPITAL_BASED_OUTPATIENT_CLINIC_OR_DEPARTMENT_OTHER): Payer: Self-pay | Admitting: *Deleted

## 2021-08-05 DIAGNOSIS — Z5181 Encounter for therapeutic drug level monitoring: Secondary | ICD-10-CM

## 2021-08-05 DIAGNOSIS — I10 Essential (primary) hypertension: Secondary | ICD-10-CM

## 2021-08-05 DIAGNOSIS — E785 Hyperlipidemia, unspecified: Secondary | ICD-10-CM

## 2021-08-05 MED ORDER — ROSUVASTATIN CALCIUM 20 MG PO TABS: 20.0000 mg | ORAL_TABLET | Freq: Every day | ORAL | 3 refills | Status: DC

## 2021-08-05 NOTE — Telephone Encounter (Signed)
Advised patient of lab results, sent new Rx to pharmacy, and mailed lab orders

## 2021-08-05 NOTE — Telephone Encounter (Signed)
Advised patient of lab results, medication change

## 2021-08-05 NOTE — Telephone Encounter (Signed)
-----   Message from Skeet Latch, MD sent at 08/04/2021  5:46 PM EDT ----- Cholesterol levels are not bad but ideally his LDL should be less than 70.  It is 88.  Recommend increasing rosuvastatin to 20 mg and repeating lipids and a CMP in 2 to 3 months.  Kidney function is mildly abnormal but stable.

## 2021-08-12 ENCOUNTER — Other Ambulatory Visit: Payer: Self-pay

## 2021-08-12 ENCOUNTER — Ambulatory Visit (INDEPENDENT_AMBULATORY_CARE_PROVIDER_SITE_OTHER): Payer: Medicare Other | Admitting: Pharmacist Clinician (PhC)/ Clinical Pharmacy Specialist

## 2021-08-12 VITALS — BP 132/84 | HR 59 | Resp 15 | Ht 68.5 in | Wt 175.3 lb

## 2021-08-12 DIAGNOSIS — I2510 Atherosclerotic heart disease of native coronary artery without angina pectoris: Secondary | ICD-10-CM

## 2021-08-12 DIAGNOSIS — I2584 Coronary atherosclerosis due to calcified coronary lesion: Secondary | ICD-10-CM | POA: Diagnosis not present

## 2021-08-12 DIAGNOSIS — I10 Essential (primary) hypertension: Secondary | ICD-10-CM

## 2021-08-12 MED ORDER — LOSARTAN POTASSIUM 100 MG PO TABS: 100.0000 mg | ORAL_TABLET | Freq: Every day | ORAL | 3 refills | Status: DC

## 2021-08-12 NOTE — Progress Notes (Signed)
08/13/2021 Chris Navarro February 22, 1935 [id number]   HPI:  Chris Navarro is a 86 y.o. male patient of Dr Oval Linsey, with a PMH below who presents today for advanced hypertension clinic follow up.  At his last visit with her a month ago, pressure was noted to be 160/82.  At that time he was complaining that the carvedilol made him feel woozy and fatigued, so it was switched to nebivolol.    Today he returns for follow up, with his wife.  He is feeling much better with the nebivolol, and has no complaints of fatigue, dizziness, CP or edema.    Past Medical History: Thoracic aortic aneurysm Descending, now 4.6 cm, started on carvedilol  Coronary artery calcification Seen on chest CT     Blood Pressure Goal:  130/80  Current Medications: nebivolol 20 mg, losartan 50 mg   Social Hx: no tobacco, no alcohol, caffeine free tea, coffee  Diet: eats home cooked meals, plenty of vegetables, no added salt, very few processed foods.   Exercise: golf regularly - 18 holes 2-3 times week, with cart, still manages to walk 1-2 miles on course  Home BP readings: home cuff 51-67 years old; read within 10 points. Had 8 readings over past 2 weeks, with average 135/73 and range 124-149/68-81  Intolerances: atorvastatin - transaminitis  Labs: 9/22:  Na 143, K 4.7, Glu 101, BUN 21, SCr 1.44   Wt Readings from Last 3 Encounters:  08/12/21 175 lb 4.8 oz (79.5 kg)  07/11/21 175 lb 3.2 oz (79.5 kg)  03/28/20 182 lb (82.6 kg)   BP Readings from Last 3 Encounters:  08/12/21 132/84  07/11/21 (!) 160/82  03/28/20 135/82   Pulse Readings from Last 3 Encounters:  08/12/21 (!) 59  07/11/21 60  03/28/20 (!) 57    Current Outpatient Medications  Medication Sig Dispense Refill   aspirin 81 MG tablet Take 81 mg by mouth daily.     losartan (COZAAR) 100 MG tablet Take 1 tablet (100 mg total) by mouth daily. 90 tablet 3   Magnesium 500 MG TABS Take 500 mg by mouth daily.     Nebivolol HCl 20 MG TABS Take 1  tablet (20 mg total) by mouth daily. 90 tablet 3   rosuvastatin (CRESTOR) 20 MG tablet Take 1 tablet (20 mg total) by mouth daily. 90 tablet 3   No current facility-administered medications for this visit.    Allergies  Allergen Reactions   Atorvastatin     transaminitis    Past Medical History:  Diagnosis Date   Coronary artery calcification 09/19/2015   Essential hypertension 09/19/2015   Hyperlipidemia 02/11/2016   Prostate cancer ([place])    Skin cancer melanoma   Stroke (cerebrum) ([place]) 09/19/2015   Stroke ([place]) mini stroke   Syncope 09/19/2015   Thoracic aortic aneurysm 09/19/2015   Descending; 4.3 cm 08/2015 on CT scan    Blood pressure 132/84, pulse (!) 59, resp. rate 15, height 5' 8.5" (1.74 m), weight 175 lb 4.8 oz (79.5 kg), SpO2 97 %.  Essential hypertension Patient with essential hypertension, not at goal with current medications.  Discussed importance of keeping BP controlled with aortic aneurysm.  Will have him increase losartan to 100 mg daily and continue 20 mg dose of nebivolol.  Because he lives close to 2 hours away, I asked that he reach out to me in 3-4 weeks and let me know what home readings look like.  We can make any adjustments needed over the  phone, then bring him back to [place] if needed after that.  He will need metabolic panel in 2 weeks, can get that done in [place], orders given.    Tommy Medal PharmD CPP [facility] [address] [place], [place] [address] [phone]

## 2021-08-12 NOTE — Patient Instructions (Signed)
Call or send MyChart message in 3-4 weeks with your home BP readings.  Tommy Medal at [phone]  Go to the lab in 2 weeks to check kidney function  Check your blood pressure at home 3-4 times each week and keep record of the readings.  Take your BP meds as follows:  Increase losartan to 100 mg once daily  Continue with all other medications  Bring all of your meds, your BP cuff and your record of home blood pressures to your next appointment.  Exercise as you're able, try to walk approximately 30 minutes per day.  Keep salt intake to a minimum, especially watch canned and prepared boxed foods.  Eat more fresh fruits and vegetables and fewer canned items.  Avoid eating in fast food restaurants.    HOW TO TAKE YOUR BLOOD PRESSURE: Rest 5 minutes before taking your blood pressure.  Don't smoke or drink caffeinated beverages for at least 30 minutes before. Take your blood pressure before (not after) you eat. Sit comfortably with your back supported and both feet on the floor (don't cross your legs). Elevate your arm to heart level on a table or a desk. Use the proper sized cuff. It should fit smoothly and snugly around your bare upper arm. There should be enough room to slip a fingertip under the cuff. The bottom edge of the cuff should be 1 inch above the crease of the elbow. Ideally, take 3 measurements at one sitting and record the average.

## 2021-08-13 ENCOUNTER — Encounter: Payer: Self-pay | Admitting: Pharmacist Clinician (PhC)/ Clinical Pharmacy Specialist

## 2021-08-13 NOTE — Assessment & Plan Note (Signed)
Patient with essential hypertension, not at goal with current medications.  Discussed importance of keeping BP controlled with aortic aneurysm.  Will have him increase losartan to 100 mg daily and continue 20 mg dose of nebivolol.  Because he lives close to 2 hours away, I asked that he reach out to me in 3-4 weeks and let me know what home readings look like.  We can make any adjustments needed over the phone, then bring him back to [place] if needed after that.  He will need metabolic panel in 2 weeks, can get that done in [place], orders given.

## 2021-08-26 ENCOUNTER — Telehealth: Payer: Self-pay

## 2021-08-26 NOTE — Telephone Encounter (Signed)
Bp readings are as follows and will route to kristin alvstad as the pt wanted them to be read by her 10/6 124/69 10/8 130/71 10/12 143/78 10/13 113/76 10/14 131/71 10/17 137/77

## 2021-08-28 ENCOUNTER — Other Ambulatory Visit: Payer: Self-pay | Admitting: Cardiovascular Disease

## 2021-08-29 LAB — COMPREHENSIVE METABOLIC PANEL
ALT: 12 IU/L (ref 0–44)
AST: 15 IU/L (ref 0–40)
Albumin/Globulin Ratio: 1.8 (ref 1.2–2.2)
Albumin: 4.6 g/dL (ref 3.6–4.6)
Alkaline Phosphatase: 72 IU/L (ref 44–121)
BUN/Creatinine Ratio: 16 (ref 10–24)
BUN: 24 mg/dL (ref 8–27)
Bilirubin Total: 0.5 mg/dL (ref 0.0–1.2)
CO2: 20 mmol/L (ref 20–29)
Calcium: 9.5 mg/dL (ref 8.6–10.2)
Chloride: 105 mmol/L (ref 96–106)
Creatinine, Ser: 1.5 mg/dL — ABNORMAL HIGH (ref 0.76–1.27)
Globulin, Total: 2.5 g/dL (ref 1.5–4.5)
Glucose: 91 mg/dL (ref 70–99)
Potassium: 5.4 mmol/L — ABNORMAL HIGH (ref 3.5–5.2)
Sodium: 142 mmol/L (ref 134–144)
Total Protein: 7.1 g/dL (ref 6.0–8.5)
eGFR: 46 mL/min/{1.73_m2} — ABNORMAL LOW (ref >59)

## 2021-08-29 LAB — LIPID PANEL W/O CHOL/HDL RATIO
Cholesterol, Total: 159 mg/dL (ref 100–199)
HDL: 60 mg/dL (ref >39)
LDL Chol Calc (NIH): 77 mg/dL (ref 0–99)
Triglycerides: 123 mg/dL (ref 0–149)
VLDL Cholesterol Cal: 22 mg/dL (ref 5–40)

## 2021-08-29 LAB — SPECIMEN STATUS REPORT

## 2021-09-01 NOTE — Telephone Encounter (Signed)
Returned call to patient.  Home readings average 130/74.   Advised that he continue with current regimen and monitor 3-4 times per week.  If home average goes consistently > 130, he should reach out for medication adjustment.  Patient voiced understanding.

## 2021-09-15 ENCOUNTER — Telehealth: Payer: Self-pay

## 2021-09-15 NOTE — Telephone Encounter (Signed)
Patient's wife calling with question regarding pt's Losartan.  Please call to discuss.  Thank you

## 2021-09-15 NOTE — Telephone Encounter (Signed)
FYI--Patient's wife states the issue has been resolved. Call back not needed.

## 2021-10-19 ENCOUNTER — Other Ambulatory Visit: Payer: Self-pay | Admitting: Cardiovascular Disease

## 2022-04-13 ENCOUNTER — Ambulatory Visit (INDEPENDENT_AMBULATORY_CARE_PROVIDER_SITE_OTHER): Payer: Medicare Other | Admitting: Cardiovascular Disease

## 2022-04-13 ENCOUNTER — Encounter (HOSPITAL_BASED_OUTPATIENT_CLINIC_OR_DEPARTMENT_OTHER): Payer: Self-pay | Admitting: Cardiovascular Disease

## 2022-04-13 VITALS — BP 132/70 | HR 59 | Ht 68.5 in | Wt 180.4 lb

## 2022-04-13 DIAGNOSIS — I712 Thoracic aortic aneurysm, without rupture, unspecified: Secondary | ICD-10-CM | POA: Diagnosis not present

## 2022-04-13 DIAGNOSIS — I2510 Atherosclerotic heart disease of native coronary artery without angina pectoris: Secondary | ICD-10-CM | POA: Diagnosis not present

## 2022-04-13 DIAGNOSIS — I10 Essential (primary) hypertension: Secondary | ICD-10-CM

## 2022-04-13 DIAGNOSIS — I2584 Coronary atherosclerosis due to calcified coronary lesion: Secondary | ICD-10-CM

## 2022-04-13 DIAGNOSIS — I63019 Cerebral infarction due to thrombosis of unspecified vertebral artery: Secondary | ICD-10-CM | POA: Diagnosis not present

## 2022-04-13 NOTE — Assessment & Plan Note (Signed)
Blood pressure has been very well-controlled.  He is tolerating nebivolol much better than carvedilol.  Continue losartan.

## 2022-04-13 NOTE — Assessment & Plan Note (Signed)
Stable on imaging and blood pressures well controlled.  Continue nebivolol.  We will repeat his echo in 6 months and we will see him the same day given that he has to travel to make his appointments.

## 2022-04-13 NOTE — Patient Instructions (Signed)
Medication Instructions:  Your physician recommends that you continue on your current medications as directed. Please refer to the Current Medication list given to you today.   *If you need a refill on your cardiac medications before your next appointment, please call your pharmacy*  Lab Work: NONE  Testing/Procedures: Your physician has requested that you have an echocardiogram. Echocardiography is a painless test that uses sound waves to create images of your heart. It provides your doctor with information about the size and shape of your heart and how well your heart's chambers and valves are working. This procedure takes approximately one hour. There are no restrictions for this procedure.  SAME DAY APPOINTMENT WITH DR [facility]   Follow-Up: At [facility], you and your health needs are our priority.  As part of our continuing mission to provide you with exceptional heart care, we have created designated Provider Care Teams.  These Care Teams include your primary Cardiologist (physician) and Advanced Practice Providers (APPs -  Physician Assistants and Nurse Practitioners) who all work together to provide you with the care you need, when you need it.  We recommend signing up for the patient portal called "MyChart".  Sign up information is provided on this After Visit Summary.  MyChart is used to connect with patients for Virtual Visits (Telemedicine).  Patients are able to view lab/test results, encounter notes, upcoming appointments, etc.  Non-urgent messages can be sent to your provider as well.   To learn more about what you can do with MyChart, go to [email or web address].    Your next appointment:   6 month(s) SAME DAY AS ECHO   The format for your next appointment:   In Person  Provider:   Skeet Latch, MD

## 2022-04-13 NOTE — Progress Notes (Signed)
Cardiology Office Note   Date:  04/13/2022   ID:  [name], DOB 13-Jan-1935, MRN [id number]  PCP:  Howell Rucks, PA  Cardiologist:  Skeet Latch, MD  Electrophysiologist:  None   Evaluation Performed:  Follow-Up Visit  Chief Complaint:  hypertension  History of Present Illness:    Chris Navarro is an 87 y.o. male with prior stroke, bradycardia, moderate ascending aortic aneurysm, coronary calcification and moderate aortic regurgitation who presents for follow up.  [name] was first seen in 09/2015.  He had an episode of syncope while on a transcontinental flight 08/2015.   He was seen at [facility] where his workup was reportedly unremarkable. The event was thought to be due to intravascular volume depletion. He had a CT scan that was negative for pulmonary embolism, but did show some coronary calcifications. It also showed an ascending thoracic aortic aneurysm of 4.3 cm. He was told to follow-up with cardiologist.  [name] also had a Holter monitor placed that revealed mostly sinus rhythm with 34 minutes of bradycardia with heart rates in the upper 40s. In addition it revealed a 16 beat run of supraventricular tachycardia.  He denied any symptoms while wearing the monitor and believed that he was up and getting dressed during the episode of bradycardia.   [name] was referred for an exercise Cardiolite 09/2015.  He exercised for 6 minutes and there were no perfusion defects. LVEF was 48%.  He subsequently had an echo done in [place] with LVEF 55-60%.  There was moderate LA enlargement, mild mitral regurgitation, and moderate aortic regurgitation.  He was noted to have a mild ascending aortic aneurysm measuring 4.3 cm.  [name] had a repeat echo 02/26/17 that revealed LVEF 60-65% with mild to moderate aortic regurgitation and a mild ascending aortic aneurysm that was unchanged in size.  However his last echo 03/2019 showed that his LVEF was labile but aneurysm has increased to 4.6  cm.  [name]'s blood pressure was running high.  His aortic aneurysm was also increasing, so carvedilol was added.  He had a repeat echocardiogram 03/2020 that showed a ascending aortic aneurysm was still 4.6 cm.  Since adding carvedilol he started having lightheadedness and fatigue. Due to fatigue, we switched carvedilol to Nebivolol.  He followed up with our pharmacist 08/2021 and was feeling much better. His blood pressures were slightly above goal, so losartan was increased. He followed up virtually 08/2021 and his blood pressure was 130/74.  Today, he appears well. However, in the past month he has noticed sharp pains in his left wrist, especially with certain angular movements. He is currently wearing a wrist brace. 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 For activity he enjoys playing golf. Afterwards he feels fatigued but he denies anginal symptoms. At home his blood pressure is usually ranging 125-130s/59-64. Sometimes if he stands up too quickly he becomes lightheaded. He also notes having sporadic memory loss, which he would attribute to his age. Lately his diet is reportedly good, with occasional fried foods and very rare red meats. He denies any palpitations, chest pain, shortness of breath, or peripheral edema. No headaches, syncope, orthopnea, or PND.  Past Medical History:  Diagnosis Date   Coronary artery calcification 09/19/2015   Essential hypertension 09/19/2015   Hyperlipidemia 02/11/2016   Prostate cancer ([place])    Skin cancer melanoma   Stroke (cerebrum) ([place]) 09/19/2015   Stroke (  [place]) mini stroke   Syncope 09/19/2015   Thoracic aortic aneurysm ([place]) 09/19/2015   Descending; 4.3 cm 08/2015 on CT scan    Past Surgical History:  Procedure Laterality Date   HERNIA REPAIR  2005   [place] EXCISION  1994     Current Outpatient Medications  Medication Sig Dispense Refill    aspirin 81 MG tablet Take 81 mg by mouth daily.     Magnesium 500 MG TABS Take 500 mg by mouth daily.     Nebivolol HCl 20 MG TABS Take 1 tablet (20 mg total) by mouth daily. 90 tablet 3   rosuvastatin (CRESTOR) 20 MG tablet Take 1 tablet (20 mg total) by mouth daily. 90 tablet 3   losartan (COZAAR) 100 MG tablet Take 1 tablet (100 mg total) by mouth daily. 90 tablet 3   No current facility-administered medications for this visit.    Allergies:   Atorvastatin    Social History:  The patient  reports that he quit smoking about 56 years ago. His smoking use included cigarettes. He has never used smokeless tobacco. He reports current alcohol use of about 1.0 - 2.0 standard drink per week.   Family History:  The patient's family history includes Angina in his brother; Cancer in his sister; Diabetes in his brother; Stroke in his father.    ROS:   Please see the history of present illness. (+) Left wrist pain (+) Lightheadedness (+) Fatigue All other systems are reviewed and negative.    PHYSICAL EXAM: VS:  BP 132/70   Pulse (!) 59   Ht 5' 8.5" (1.74 m)   Wt 180 lb 6.4 oz (81.8 kg)   BMI 27.03 kg/m  , BMI Body mass index is 27.03 kg/m. GENERAL:  Well appearing HEENT: Pupils equal round and reactive, fundi not visualized, oral mucosa unremarkable NECK:  No jugular venous distention, waveform within normal limits, carotid upstroke brisk and symmetric, no bruits, no thyromegaly LYMPHATICS:  No cervical adenopathy LUNGS:  Clear to auscultation bilaterally HEART:  RRR.  PMI not displaced or sustained,S1 and S2 within normal limits, no S3, no S4, no clicks, no rubs, no murmurs ABD:  Flat, positive bowel sounds normal in frequency in pitch, no bruits, no rebound, no guarding, no midline pulsatile mass, no hepatomegaly, no splenomegaly EXT:  2 plus pulses throughout, trace ankle edema bilaterally, no cyanosis no clubbing SKIN:  No rashes no nodules NEURO:  Cranial nerves II through XII  grossly intact, motor grossly intact throughout PSYCH:  Cognitively intact, oriented to person place and time   EKG:   EKG is personally reviewed. 04/13/2022:  EKG was not ordered. 07/11/2021: Sinus rhythm. Rate 60 bpm. 02/01/2020: Sinus rhythm.  Rate 68 bpm. 06/28/18: Sinus bradycardia.  Rate 53 bpm.   02/26/17: Sinus rhythm. Rate 62 bpm. 10/02/15: sinus rhythm rate 76 bpm.   Echo 07/11/2021:  1. Left ventricular ejection fraction, by estimation, is 55 to 60%. The  left ventricle has normal function. The left ventricle has no regional  wall motion abnormalities. The left ventricular internal cavity size was  mildly dilated. Left ventricular  diastolic parameters are consistent with Grade I diastolic dysfunction  (impaired relaxation). The average left ventricular global longitudinal  strain is -18.9 %. The global longitudinal strain is normal.   2. Right ventricular systolic function is normal. The right ventricular  size is mildly enlarged. There is normal pulmonary artery systolic  pressure.   3. The mitral  valve is normal in structure. Mild mitral valve  regurgitation. No evidence of mitral stenosis.   4. The aortic valve is normal in structure. Aortic valve regurgitation is  moderate. No aortic stenosis is present.   5. Pulmonic valve regurgitation is moderate.   6. There is mild dilatation of the ascending aorta, measuring 44 mm.  There is mild dilatation of the aortic root, measuring 42 mm.   7. The inferior vena cava is normal in size with greater than 50%  respiratory variability, suggesting right atrial pressure of 3 mmHg.   Comparison(s): 03/21/20 EF 55-60%. Mild AI. Prior measure of Ao 46 mm.   Echo 03/2020: 1. Left ventricular ejection fraction, by estimation, is 55 to 60%. The  left ventricle has normal function. The left ventricle has no regional  wall motion abnormalities. Left ventricular diastolic parameters are  consistent with Grade I diastolic  dysfunction (impaired  relaxation).   2. Right ventricular systolic function is normal. The right ventricular  size is normal. There is normal pulmonary artery systolic pressure.   3. Left atrial size was mildly dilated.   4. The mitral valve is normal in structure. Mild mitral valve  regurgitation. No evidence of mitral stenosis.   5. The aortic valve is normal in structure. Aortic valve regurgitation is  mild. No aortic stenosis is present. Aortic regurgitation PHT measures 702  msec.   6. Aortic dilatation noted. Aneurysm of the ascending aorta, measuring 46  mm. There is moderate dilatation of the aortic root measuring 44 mm.   7. The inferior vena cava is normal in size with greater than 50%  respiratory variability, suggesting right atrial pressure of 3 mmHg.    Echo 03/28/19: IMPRESSIONS   1. The left ventricle has normal systolic function, with an ejection  fraction of 55-60%. The cavity size was mildly dilated. Left ventricular  diastolic Doppler parameters are consistent with impaired relaxation.   2. The right ventricle has normal systolic function. The cavity was  normal.   3. The mitral valve is abnormal. Mild thickening of the mitral valve  leaflet.   4. The tricuspid valve is grossly normal.   5. The aortic valve is tricuspid. Mild thickening of the aortic valve.  Aortic valve regurgitation is mild by color flow Doppler. No stenosis of  the aortic valve.   6. Aneurysm of the ascending aorta, measuring 46 mm.   7. The interatrial septum is aneurysmal.   8. Normal LV systolic function; mild LVE; mild diastolic dysfunction;  ascending aortic aneurysm (4.6 cm); mild AI and MR.    Recent Labs: 08/28/2021: ALT 12; BUN 24; Creatinine, Ser 1.50; Potassium 5.4; Sodium 142    Lipid Panel    Component Value Date/Time   CHOL 159 08/28/2021 1026   TRIG 123 08/28/2021 1026   HDL 60 08/28/2021 1026   CHOLHDL 3.0 07/22/2021 1003   CHOLHDL 2.3 02/26/2017 1410   VLDL 13 02/26/2017 1410   LDLCALC  77 08/28/2021 1026      Wt Readings from Last 3 Encounters:  04/13/22 180 lb 6.4 oz (81.8 kg)  08/12/21 175 lb 4.8 oz (79.5 kg)  07/11/21 175 lb 3.2 oz (79.5 kg)      ASSESSMENT AND PLAN:  Essential hypertension Blood pressure has been very well-controlled.  He is tolerating nebivolol much better than carvedilol.  Continue losartan.   Stroke (cerebrum) ([facility]) Continue aspirin and statin.   Thoracic aortic aneurysm ([facility]) Stable on imaging and blood pressures well controlled.  Continue  nebivolol.  We will repeat his echo in 6 months and we will see him the same day given that he has to travel to make his appointments.  Coronary artery calcification Asymptomatic.  Continue aspirin, beta-blocker, and statin.    Current medicines are reviewed at length with the patient today.  The patient does not have concerns regarding medicines.  The following changes have been made: none  Labs/ tests ordered today include:   Orders Placed This Encounter  Procedures   ECHOCARDIOGRAM COMPLETE    Disposition:    FU with Ahaana Rochette C. Oval Linsey, MD in 6 months.  I,Mathew Stumpf,acting as a Education administrator for Skeet Latch, MD.,have documented all relevant documentation on the behalf of Skeet Latch, MD,as directed by  Skeet Latch, MD while in the presence of Skeet Latch, MD.  I, [place] Oval Linsey, MD have reviewed all documentation for this visit.  The documentation of the exam, diagnosis, procedures, and orders on 04/13/2022 are all accurate and complete.  Signed, Skeet Latch, MD  04/13/2022 10:49 AM    [place]

## 2022-04-13 NOTE — Assessment & Plan Note (Signed)
Continue aspirin and statin. 

## 2022-04-13 NOTE — Assessment & Plan Note (Signed)
Asymptomatic.  Continue aspirin, beta-blocker, and statin.

## 2022-04-14 LAB — BASIC METABOLIC PANEL
BUN/Creatinine Ratio: 14 (ref 10–24)
BUN: 21 mg/dL (ref 8–27)
CO2: 23 mmol/L (ref 20–29)
Calcium: 9.6 mg/dL (ref 8.6–10.2)
Chloride: 105 mmol/L (ref 96–106)
Creatinine, Ser: 1.45 mg/dL — ABNORMAL HIGH (ref 0.76–1.27)
Glucose: 98 mg/dL (ref 70–99)
Potassium: 5.1 mmol/L (ref 3.5–5.2)
Sodium: 141 mmol/L (ref 134–144)
eGFR: 48 mL/min/{1.73_m2} — ABNORMAL LOW (ref >59)

## 2022-07-08 ENCOUNTER — Telehealth (HOSPITAL_BASED_OUTPATIENT_CLINIC_OR_DEPARTMENT_OTHER): Payer: Self-pay | Admitting: Cardiovascular Disease

## 2022-07-08 MED ORDER — NEBIVOLOL HCL 20 MG PO TABS: 20.0000 mg | ORAL_TABLET | Freq: Every day | ORAL | 3 refills | Status: DC

## 2022-07-08 NOTE — Telephone Encounter (Signed)
Reviewed chart and according to Dr Blenda Mounts last visit patient to continue Nebivolol  Advised patient, confirmed taking and refilled as requested

## 2022-07-08 NOTE — Telephone Encounter (Signed)
Pt c/o medication issue:  1. Name of Medication: Nebivolol  2. How are you currently taking this medication (dosage and times per day)?   3. Are you having a reaction (difficulty breathing--STAT)?   4. What is your medication issue? Wants to know if he needs to continue taking this medicine- He said the pharmacist said it was cancelled

## 2022-07-22 ENCOUNTER — Telehealth (HOSPITAL_BASED_OUTPATIENT_CLINIC_OR_DEPARTMENT_OTHER): Payer: Self-pay | Admitting: Cardiovascular Disease

## 2022-07-22 NOTE — Telephone Encounter (Signed)
Spoke with patients wife regarding frequent urinating Wife denies and change in color, odor, or feeling of not emptying bladder Confirmed pill was correct medication Advised not a side effect to follow up with PCP

## 2022-07-22 NOTE — Telephone Encounter (Signed)
Pt c/o medication issue:  1. Name of Medication:   Nebivolol HCl 20 MG TABS  2. How are you currently taking this medication (dosage and times per day)? As prescribed  3. Are you having a reaction (difficulty breathing--STAT)? No  4. What is your medication issue?   Wife called stating the patient's medication was recently refilled with a generic version of this medication and the tablets look different. Wife wants to know if this medication can cause the patient frequent urination.

## 2022-08-03 ENCOUNTER — Telehealth: Payer: Self-pay | Admitting: Cardiovascular Disease

## 2022-08-03 NOTE — Telephone Encounter (Signed)
Previously routed to pharmd for review

## 2022-08-03 NOTE — Telephone Encounter (Signed)
   Pt called back, he said his losartan potassium was change as well  from 100 mg to 50 mg

## 2022-08-03 NOTE — Telephone Encounter (Signed)
Nebivlol and tamsulosin can lower blood pressure. Patient should monitor

## 2022-08-03 NOTE — Telephone Encounter (Signed)
Will forward to Pharm D for review  

## 2022-08-03 NOTE — Telephone Encounter (Signed)
Advised patient, verbalized understanding  

## 2022-08-03 NOTE — Telephone Encounter (Signed)
Pt c/o medication issue:  1. Name of Medication:  Tamsulosin  Ciprofloxacin  2. How are you currently taking this medication (dosage and times per day)?  Tamsulosin - '4mg'$  daily   Ciprofloxacin - '500mg'$  2x daily with meals    3. Are you having a reaction (difficulty breathing--STAT)? no  4. What is your medication issue? Pt was prescribed these medications by Dr. Marti Sleigh [facility] and wants to know are they are okay for him to take with his cardiac medications.  Dr. Genelle Gather - [phone]

## 2022-08-31 ENCOUNTER — Telehealth: Payer: Self-pay | Admitting: Cardiovascular Disease

## 2022-08-31 MED ORDER — ROSUVASTATIN CALCIUM 20 MG PO TABS: 20.0000 mg | ORAL_TABLET | Freq: Every day | ORAL | 3 refills | Status: DC

## 2022-08-31 NOTE — Telephone Encounter (Signed)
Rx request sent to pharmacy.  

## 2022-08-31 NOTE — Telephone Encounter (Signed)
*  STAT* If patient is at the pharmacy, call can be transferred to refill team.   1. Which medications need to be refilled? (please list name of each medication and dose if known) rosuvastatin (CRESTOR) 20 MG tablet  2. Which pharmacy/location (including street and city if local pharmacy) is medication to be sent to? [place], [place]  3. Do they need a 30 day or 90 day supply? 90 day

## 2022-10-19 ENCOUNTER — Ambulatory Visit (INDEPENDENT_AMBULATORY_CARE_PROVIDER_SITE_OTHER): Payer: Medicare Other

## 2022-10-19 ENCOUNTER — Encounter (HOSPITAL_BASED_OUTPATIENT_CLINIC_OR_DEPARTMENT_OTHER): Payer: Self-pay | Admitting: Cardiovascular Disease

## 2022-10-19 ENCOUNTER — Other Ambulatory Visit (INDEPENDENT_AMBULATORY_CARE_PROVIDER_SITE_OTHER): Payer: Medicare Other

## 2022-10-19 ENCOUNTER — Ambulatory Visit (INDEPENDENT_AMBULATORY_CARE_PROVIDER_SITE_OTHER): Payer: Medicare Other | Admitting: Cardiovascular Disease

## 2022-10-19 VITALS — BP 110/58 | HR 56 | Ht 68.5 in | Wt 182.0 lb

## 2022-10-19 DIAGNOSIS — I2584 Coronary atherosclerosis due to calcified coronary lesion: Secondary | ICD-10-CM

## 2022-10-19 DIAGNOSIS — I712 Thoracic aortic aneurysm, without rupture, unspecified: Secondary | ICD-10-CM | POA: Diagnosis not present

## 2022-10-19 DIAGNOSIS — E785 Hyperlipidemia, unspecified: Secondary | ICD-10-CM

## 2022-10-19 DIAGNOSIS — I2510 Atherosclerotic heart disease of native coronary artery without angina pectoris: Secondary | ICD-10-CM | POA: Diagnosis not present

## 2022-10-19 DIAGNOSIS — R42 Dizziness and giddiness: Secondary | ICD-10-CM | POA: Diagnosis not present

## 2022-10-19 DIAGNOSIS — I63019 Cerebral infarction due to thrombosis of unspecified vertebral artery: Secondary | ICD-10-CM

## 2022-10-19 DIAGNOSIS — I7121 Aneurysm of the ascending aorta, without rupture: Secondary | ICD-10-CM

## 2022-10-19 DIAGNOSIS — I10 Essential (primary) hypertension: Secondary | ICD-10-CM

## 2022-10-19 MED ORDER — NEBIVOLOL HCL 10 MG PO TABS: 10.0000 mg | ORAL_TABLET | Freq: Every day | ORAL | 3 refills | Status: DC

## 2022-10-19 MED ORDER — LOSARTAN POTASSIUM 50 MG PO TABS: 50.0000 mg | ORAL_TABLET | Freq: Every day | ORAL | 3 refills | Status: DC

## 2022-10-19 NOTE — Assessment & Plan Note (Signed)
Lipids are well-controlled.  Continue aspirin and rosuvastatin.

## 2022-10-19 NOTE — Assessment & Plan Note (Signed)
Repeat echo today.  His ascending aorta was stable.  Blood pressure control as above.

## 2022-10-19 NOTE — Assessment & Plan Note (Addendum)
Blood pressure is very well-controlled today.  However he reports episodes of lightheadedness.  He has not been checking his pressure well at home.  He notes that he has had some lows and some highs.  He is going to check his blood pressure twice a day and bring to follow-up.  Check orthostatic vital signs now.  Continue losartan and nebivolol.  He is also on tamsulosin which could be contributing to orthostatic hypotension.  Addendum: He was noted to be orthostatic in the office today.  Blood pressures dropped from 142/80 lying to 86/56 with standing.  This did increase somewhat after 3 minutes to 94/60.  Heart rates ranged from 55-65.  We will reduce his nebivolol to 10 mg and track blood pressures as planned above.

## 2022-10-19 NOTE — Assessment & Plan Note (Addendum)
Continue rosuvastatin.  Check fasting lipids and a CMP.

## 2022-10-19 NOTE — Assessment & Plan Note (Signed)
Symptoms are most prominent in the mornings and have been very frequent.  He does have some bradycardia.  Unclear if this is contributing.  Check orthostatic vital signs and he will wear a 7-day ambulatory monitor.  Check blood pressures twice daily at home.

## 2022-10-19 NOTE — Progress Notes (Signed)
Cardiology Office Note   Date:  11/15/2022   ID:  Chris Navarro, DOB 1935-10-13, MRN [id number]  PCP:  Howell Rucks, PA  Cardiologist:  Skeet Latch, MD  Electrophysiologist:  None   Evaluation Performed:  Follow-Up Visit  Chief Complaint:  hypertension  History of Present Illness:    Chris Navarro is an 87 y.o. male with prior stroke, bradycardia, moderate ascending aortic aneurysm, coronary calcification and moderate aortic regurgitation who presents for follow up.  Chris Navarro was first seen in 09/2015.  He had an episode of syncope while on a transcontinental flight 08/2015.   He was seen at [facility] where his workup was reportedly unremarkable. The event was thought to be due to intravascular volume depletion. He had a CT scan that was negative for pulmonary embolism, but did show some coronary calcifications. It also showed an ascending thoracic aortic aneurysm of 4.3 cm. He was told to follow-up with cardiologist.  Chris Navarro also had a Holter monitor placed that revealed mostly sinus rhythm with 34 minutes of bradycardia with heart rates in the upper 40s. In addition it revealed a 16 beat run of supraventricular tachycardia.  He denied any symptoms while wearing the monitor and believed that he was up and getting dressed during the episode of bradycardia.   Chris Navarro was referred for an exercise Cardiolite 09/2015.  He exercised for 6 minutes and there were no perfusion defects. LVEF was 48%.  He subsequently had an echo done in [place] with LVEF 55-60%.  There was moderate LA enlargement, mild mitral regurgitation, and moderate aortic regurgitation.  He was noted to have a mild ascending aortic aneurysm measuring 4.3 cm.  Chris Navarro had a repeat echo 02/26/17 that revealed LVEF 60-65% with mild to moderate aortic regurgitation and a mild ascending aortic aneurysm that was unchanged in size.  However his last echo 03/2019 showed that his LVEF was labile but aneurysm has increased to 4.6  cm.  Chris Navarro's blood pressure was running high.  His aortic aneurysm was also increasing, so carvedilol was added.  He had a repeat echocardiogram 03/2020 that showed a ascending aortic aneurysm was still 4.6 cm.  Since adding carvedilol he started having lightheadedness and fatigue. Due to fatigue, we switched carvedilol to Nebivolol.  He followed up with our pharmacist 08/2021 and was feeling much better. His blood pressures were slightly above goal, so losartan was increased. He followed up virtually 08/2021 and his blood pressure was 130/74.  He had an echo today which revealed normal systolic function. His ascending aorta is still 4.6cm.  Today, he reports that he has been having difficulties particularly in the mornings. He reports stiffness in his neck which radiates down to his low back after waking up. He also experiences intermittent easy fatigability and lightheadedness most mornings even with ADLs such as taking a shower. His symptoms occur even before he takes his medications in the morning. He feels that his symptoms are relieved when he stays busy or is active and exerting himself. He does not check his BP when he is symptomatic. However, when he does check it it's usually around 130/65. His Losartan was reduced to '50mg'$  recently. He feels that his symptoms may have started after his Flomax was increased.  The patient denies chest pain, chest pressure, dyspnea at rest or with exertion, PND, orthopnea, or leg swelling. Denies cough, fever, chills, nausea, or vomiting. Denies syncope, presyncope, or snoring.  Past Medical History:  Diagnosis Date  Coronary artery calcification 09/19/2015   Essential hypertension 09/19/2015   Hyperlipidemia 02/11/2016   Lightheadedness 10/19/2022   Prostate cancer ([place])    Skin cancer melanoma   Stroke (cerebrum) ([place]) 09/19/2015   Stroke ([place]) mini stroke   Syncope 09/19/2015   Thoracic aortic aneurysm ([place]) 09/19/2015   Descending; 4.3 cm 08/2015  on CT scan    Past Surgical History:  Procedure Laterality Date   HERNIA REPAIR  2005   [facility]     Current Outpatient Medications  Medication Sig Dispense Refill   aspirin 81 MG tablet Take 81 mg by mouth daily.     Magnesium 500 MG TABS Take 500 mg by mouth daily.     nebivolol (BYSTOLIC) 10 MG tablet Take 1 tablet (10 mg total) by mouth daily. 90 tablet 3   rosuvastatin (CRESTOR) 20 MG tablet Take 1 tablet (20 mg total) by mouth daily. 90 tablet 3   tamsulosin (FLOMAX) 0.4 MG CAPS capsule Take 0.4 mg by mouth daily.     losartan (COZAAR) 25 MG tablet Take 1 tablet (25 mg total) by mouth at bedtime. 90 tablet 3   No current facility-administered medications for this visit.    Allergies:   Atorvastatin    Social History:  The patient  reports that he quit smoking about 57 years ago. His smoking use included cigarettes. He has never used smokeless tobacco. He reports current alcohol use of about 1.0 - 2.0 standard drink of alcohol per week.   Family History:  The patient's family history includes Angina in his brother; Cancer in his sister; Diabetes in his brother; Stroke in his father.    ROS:   Please see the history of present illness. + Back/neck pain + Lightheadedness + Fatigue All other systems are reviewed and negative.    PHYSICAL EXAM: VS:  BP (!) 110/58 (BP Location: Left Arm, Patient Position: Sitting, Cuff Size: Normal)   Pulse (!) 56   Ht 5' 8.5" (1.74 m)   Wt 182 lb (82.6 kg)   BMI 27.27 kg/m  , BMI Body mass index is 27.27 kg/m. GENERAL:  Well appearing HEENT: Pupils equal round and reactive, fundi not visualized, oral mucosa unremarkable NECK:  No jugular venous distention, waveform within normal limits, carotid upstroke brisk and symmetric, no bruits, no thyromegaly LYMPHATICS:  No cervical adenopathy LUNGS:  Clear to auscultation bilaterally HEART:  RRR.  PMI not displaced or sustained,S1 and S2 within  normal limits, no S3, no S4, no clicks, no rubs, no murmurs ABD:  Flat, positive bowel sounds normal in frequency in pitch, no bruits, no rebound, no guarding, no midline pulsatile mass, no hepatomegaly, no splenomegaly EXT:  2 plus pulses throughout, trace ankle edema bilaterally, no cyanosis no clubbing SKIN:  No rashes no nodules NEURO:  Cranial nerves II through XII grossly intact, motor grossly intact throughout PSYCH:  Cognitively intact, oriented to person place and time   EKG:   EKG ordered today, 10/19/22.  The ekg ordered today demonstrates sinus bradycardia rate of 56bpm. 04/13/2022:  EKG was not ordered. 07/11/2021: Sinus rhythm. Rate 60 bpm. 02/01/2020: Sinus rhythm.  Rate 68 bpm. 06/28/18: Sinus bradycardia.  Rate 53 bpm.   02/26/17: Sinus rhythm. Rate 62 bpm. 10/02/15: sinus rhythm rate 76 bpm.   Echo 07/11/2021:  1. Left ventricular ejection fraction, by estimation, is 55 to 60%. The  left ventricle has normal function. The left ventricle has no regional  wall motion abnormalities. The left ventricular internal cavity size was  mildly dilated. Left ventricular  diastolic parameters are consistent with Grade I diastolic dysfunction  (impaired relaxation). The average left ventricular global longitudinal  strain is -18.9 %. The global longitudinal strain is normal.   2. Right ventricular systolic function is normal. The right ventricular  size is mildly enlarged. There is normal pulmonary artery systolic  pressure.   3. The mitral valve is normal in structure. Mild mitral valve  regurgitation. No evidence of mitral stenosis.   4. The aortic valve is normal in structure. Aortic valve regurgitation is  moderate. No aortic stenosis is present.   5. Pulmonic valve regurgitation is moderate.   6. There is mild dilatation of the ascending aorta, measuring 44 mm.  There is mild dilatation of the aortic root, measuring 42 mm.   7. The inferior vena cava is normal in size with greater  than 50%  respiratory variability, suggesting right atrial pressure of 3 mmHg.   Comparison(s): 03/21/20 EF 55-60%. Mild AI. Prior measure of Ao 46 mm.   Echo 03/2020: 1. Left ventricular ejection fraction, by estimation, is 55 to 60%. The  left ventricle has normal function. The left ventricle has no regional  wall motion abnormalities. Left ventricular diastolic parameters are  consistent with Grade I diastolic  dysfunction (impaired relaxation).   2. Right ventricular systolic function is normal. The right ventricular  size is normal. There is normal pulmonary artery systolic pressure.   3. Left atrial size was mildly dilated.   4. The mitral valve is normal in structure. Mild mitral valve  regurgitation. No evidence of mitral stenosis.   5. The aortic valve is normal in structure. Aortic valve regurgitation is  mild. No aortic stenosis is present. Aortic regurgitation PHT measures 702  msec.   6. Aortic dilatation noted. Aneurysm of the ascending aorta, measuring 46  mm. There is moderate dilatation of the aortic root measuring 44 mm.   7. The inferior vena cava is normal in size with greater than 50%  respiratory variability, suggesting right atrial pressure of 3 mmHg.    Echo 03/28/19: IMPRESSIONS   1. The left ventricle has normal systolic function, with an ejection  fraction of 55-60%. The cavity size was mildly dilated. Left ventricular  diastolic Doppler parameters are consistent with impaired relaxation.   2. The right ventricle has normal systolic function. The cavity was  normal.   3. The mitral valve is abnormal. Mild thickening of the mitral valve  leaflet.   4. The tricuspid valve is grossly normal.   5. The aortic valve is tricuspid. Mild thickening of the aortic valve.  Aortic valve regurgitation is mild by color flow Doppler. No stenosis of  the aortic valve.   6. Aneurysm of the ascending aorta, measuring 46 mm.   7. The interatrial septum is aneurysmal.   8.  Normal LV systolic function; mild LVE; mild diastolic dysfunction;  ascending aortic aneurysm (4.6 cm); mild AI and MR.    Recent Labs: 10/23/2022: ALT 10; BUN 25; Creatinine, Ser 1.51; Potassium 4.6; Sodium 142    Lipid Panel    Component Value Date/Time   CHOL 139 10/23/2022 0958   TRIG 86 10/23/2022 0958   HDL 63 10/23/2022 0958   CHOLHDL 3.0 07/22/2021 1003   CHOLHDL 2.3 02/26/2017 1410   VLDL 13 02/26/2017 1410   LDLCALC 60 10/23/2022 0958      Wt Readings from Last 3 Encounters:  11/13/22 184  lb (83.5 kg)  10/19/22 182 lb (82.6 kg)  04/13/22 180 lb 6.4 oz (81.8 kg)      ASSESSMENT AND PLAN:  Coronary artery calcification Nonobstructive coronary calcification seen on chest CT.  He has no ischemic symptoms.  Continue rosuvastatin and aspirin.  He will get fasting lipids and a CMP checked.  Continue nebivolol.  He has had some bradycardia and it is unclear how this is contributing to his lightheadedness.  Continue nebivolol for now.  Essential hypertension Blood pressure is very well-controlled today.  However he reports episodes of lightheadedness.  He has not been checking his pressure well at home.  He notes that he has had some lows and some highs.  He is going to check his blood pressure twice a day and bring to follow-up.  Check orthostatic vital signs now.  Continue losartan and nebivolol.  He is also on tamsulosin which could be contributing to orthostatic hypotension.  Addendum: He was noted to be orthostatic in the office today.  Blood pressures dropped from 142/80 lying to 86/56 with standing.  This did increase somewhat after 3 minutes to 94/60.  Heart rates ranged from 55-65.  We will reduce his nebivolol to 10 mg and track blood pressures as planned above.  Stroke (cerebrum) (HCC) Lipids are well-controlled.  Continue aspirin and rosuvastatin.  Thoracic aortic aneurysm ([place]) Repeat echo today.  His ascending aorta was stable.  Blood pressure control as  above.  Hyperlipidemia Continue rosuvastatin.  Check fasting lipids and a CMP.  Lightheadedness Symptoms are most prominent in the mornings and have been very frequent.  He does have some bradycardia.  Unclear if this is contributing.  Check orthostatic vital signs and he will wear a 7-day ambulatory monitor.  Check blood pressures twice daily at home.     Current medicines are reviewed at length with the patient today.  The patient does not have concerns regarding medicines.  The following changes have been made: none  Labs/ tests ordered today include:   Orders Placed This Encounter  Procedures   Comprehensive metabolic panel   Lipid panel   LONG TERM MONITOR (3-14 DAYS)   EKG 12-Lead    Disposition:  Follow up in 1 month with Laurann Montana, NP   I,Alexis Herring,acting as a scribe for Skeet Latch, MD.,have documented all relevant documentation on the behalf of Skeet Latch, MD,as directed by  Skeet Latch, MD while in the presence of Skeet Latch, MD.  I, Burtrum Oval Linsey, MD have reviewed all documentation for this visit.  The documentation of the exam, diagnosis, procedures, and orders on 11/15/2022 are all accurate and complete.   Signed, Skeet Latch, MD  11/15/2022 7:20 AM    [facility]

## 2022-10-19 NOTE — Assessment & Plan Note (Signed)
Nonobstructive coronary calcification seen on chest CT.  He has no ischemic symptoms.  Continue rosuvastatin and aspirin.  He will get fasting lipids and a CMP checked.  Continue nebivolol.  He has had some bradycardia and it is unclear how this is contributing to his lightheadedness.  Continue nebivolol for now.

## 2022-10-19 NOTE — Patient Instructions (Addendum)
Medication Instructions:  DECREASE YOUR BYSTOLIC TO 10 MG DAILY     *If you need a refill on your cardiac medications before your next appointment, please call your pharmacy*   Lab Work: Please return for Lab work within the next couple weeks for Fasting Lipid Panel and CMP. You may come to the...   [facility] (3rd floor) [address], [place], [place] [address]  Open: 8am-Noon and 1pm-4:30pm  Please ring the doorbell on the small table when you exit the elevator and the Lab Tech will come get you  [place] [facility] [address] [place], [place], [place] [address] Open: 8am-1pm, then 2pm-4:30pm   [place]- Please see attached locations sheet stapled to your lab work with address and hours.   If you have labs (blood work) drawn today and your tests are completely normal, you will receive your results only by: [place] (if you have MyChart) OR A paper copy in the mail If you have any lab test that is abnormal or we need to change your treatment, we will call you to review the results.   Testing/Procedures: Your physician has recommended that you wear a Zio monitor.   This monitor is a medical device that records the heart's electrical activity. Doctors most often use these monitors to diagnose arrhythmias. Arrhythmias are problems with the speed or rhythm of the heartbeat. The monitor is a small device applied to your chest. You can wear one while you do your normal daily activities. While wearing this monitor if you have any symptoms to push the button and record what you felt. Once you have worn this monitor for the period of time provider prescribed (Usually 14 days), you will return the monitor device in the postage paid box. Once it is returned they will download the data collected and provide [place] with a report which the provider will then review and we will call you with those results. Important tips:  Avoid showering during  the first 24 hours of wearing the monitor. Avoid excessive sweating to help maximize wear time. Do not submerge the device, no hot tubs, and no swimming pools. Keep any lotions or oils away from the patch. After 24 hours you may shower with the patch on. Take brief showers with your back facing the shower head.  Do not remove patch once it has been placed because that will interrupt data and decrease adhesive wear time. Push the button when you have any symptoms and write down what you were feeling. Once you have completed wearing your monitor, remove and place into box which has postage paid and place in your outgoing mailbox.  If for some reason you have misplaced your box then call our office and we can provide another box and/or mail it off for you.   Follow-Up: At [facility], you and your health needs are our priority.  As part of our continuing mission to provide you with exceptional heart care, we have created designated Provider Care Teams.  These Care Teams include your primary Cardiologist (physician) and Advanced Practice Providers (APPs -  Physician Assistants and Nurse Practitioners) who all work together to provide you with the care you need, when you need it.  We recommend signing up for the patient portal called "MyChart".  Sign up information is provided on this After Visit Summary.  MyChart is used to connect with patients for Virtual Visits (Telemedicine).  Patients are able to view lab/test results, encounter  notes, upcoming appointments, etc.  Non-urgent messages can be sent to your provider as well.   To learn more about what you can do with MyChart, go to [email or web address].    Your next appointment:   1 month(s)  The format for your next appointment:   In Person  Provider:   Laurann Montana, NP    Other Instructions Please track your blood pressure two times daily and bring log with you to your upcoming appointment.   Tips to Measure your Blood  Pressure Correctly  To determine whether you have hypertension, a medical professional will take a blood pressure reading. How you prepare for the test, the position of your arm, and other factors can change a blood pressure reading by 10% or more. That could be enough to hide high blood pressure, start you on a drug you don't really need, or lead your doctor to incorrectly adjust your medications.  National and international guidelines offer specific instructions for measuring blood pressure. If a doctor, nurse, or medical assistant isn't doing it right, don't hesitate to ask him or her to get with the guidelines.  Here's what you can do to ensure a correct reading:  Don't drink a caffeinated beverage or smoke during the 30 minutes before the test.  Sit quietly for five minutes before the test begins.  During the measurement, sit in a chair with your feet on the floor and your arm supported so your elbow is at about heart level.  The inflatable part of the cuff should completely cover at least 80% of your upper arm, and the cuff should be placed on bare skin, not over a shirt.  Don't talk during the measurement.  Have your blood pressure measured twice, with a brief break in between. If the readings are different by 5 points or more, have it done a third time.  In 2017, new guidelines from the [place], the [employer] of Cardiology, and nine other health organizations lowered the diagnosis of high blood pressure to 130/80 mm Hg or higher for all adults. The guidelines also redefined the various blood pressure categories to now include normal, elevated, Stage 1 hypertension, Stage 2 hypertension, and hypertensive crisis (see "Blood pressure categories").  Blood pressure categories  Blood pressure category SYSTOLIC (upper number)  DIASTOLIC (lower number)  Normal Less than 120 mm Hg and Less than 80 mm Hg  Elevated 120-129 mm Hg and Less than 80 mm Hg  High blood  pressure: Stage 1 hypertension 130-139 mm Hg or 80-89 mm Hg  High blood pressure: Stage 2 hypertension 140 mm Hg or higher or 90 mm Hg or higher  Hypertensive crisis (consult your doctor immediately) Higher than 180 mm Hg and/or Higher than 120 mm Hg  Source: American Heart Association and American Stroke Association. For more on getting your blood pressure under control, buy Controlling Your Blood Pressure, a Special Health Report from [facility].   Blood Pressure Log   Date   Time  Blood Pressure  Position  Example: Nov 1 9 AM 124/78 sitting

## 2022-10-20 LAB — ECHOCARDIOGRAM COMPLETE
AV Vena cont: 0.41 cm
MV M vel: 4.99 m/s
MV Peak grad: 99.6 mmHg
P 1/2 time: 486 msec
Radius: 0.8 cm
S' Lateral: 2.44 cm

## 2022-10-23 ENCOUNTER — Other Ambulatory Visit (HOSPITAL_BASED_OUTPATIENT_CLINIC_OR_DEPARTMENT_OTHER): Payer: Self-pay | Admitting: Cardiovascular Disease

## 2022-10-24 LAB — COMPREHENSIVE METABOLIC PANEL
ALT: 10 IU/L (ref 0–44)
AST: 16 IU/L (ref 0–40)
Albumin/Globulin Ratio: 1.7 (ref 1.2–2.2)
Albumin: 4.2 g/dL (ref 3.7–4.7)
Alkaline Phosphatase: 67 IU/L (ref 44–121)
BUN/Creatinine Ratio: 17 (ref 10–24)
BUN: 25 mg/dL (ref 8–27)
Bilirubin Total: 0.5 mg/dL (ref 0.0–1.2)
CO2: 19 mmol/L — ABNORMAL LOW (ref 20–29)
Calcium: 9.2 mg/dL (ref 8.6–10.2)
Chloride: 107 mmol/L — ABNORMAL HIGH (ref 96–106)
Creatinine, Ser: 1.51 mg/dL — ABNORMAL HIGH (ref 0.76–1.27)
Globulin, Total: 2.5 g/dL (ref 1.5–4.5)
Glucose: 97 mg/dL (ref 70–99)
Potassium: 4.6 mmol/L (ref 3.5–5.2)
Sodium: 142 mmol/L (ref 134–144)
Total Protein: 6.7 g/dL (ref 6.0–8.5)
eGFR: 45 mL/min/{1.73_m2} — ABNORMAL LOW (ref >59)

## 2022-10-24 LAB — LIPID PANEL W/O CHOL/HDL RATIO
Cholesterol, Total: 139 mg/dL (ref 100–199)
HDL: 63 mg/dL (ref >39)
LDL Chol Calc (NIH): 60 mg/dL (ref 0–99)
Triglycerides: 86 mg/dL (ref 0–149)
VLDL Cholesterol Cal: 16 mg/dL (ref 5–40)

## 2022-10-24 LAB — SPECIMEN STATUS REPORT

## 2022-11-05 ENCOUNTER — Telehealth: Payer: Self-pay | Admitting: Cardiovascular Disease

## 2022-11-05 NOTE — Telephone Encounter (Signed)
Pt states he received a letter from [facility] stating about his out of pocket expenses and extending it. Pt thought this was covered under his insurance already, please advise.

## 2022-11-13 ENCOUNTER — Encounter (HOSPITAL_BASED_OUTPATIENT_CLINIC_OR_DEPARTMENT_OTHER): Payer: Self-pay | Admitting: Family

## 2022-11-13 ENCOUNTER — Ambulatory Visit (INDEPENDENT_AMBULATORY_CARE_PROVIDER_SITE_OTHER): Payer: Medicare Other | Admitting: Family

## 2022-11-13 VITALS — BP 104/60 | HR 75 | Ht 68.5 in | Wt 184.0 lb

## 2022-11-13 DIAGNOSIS — E785 Hyperlipidemia, unspecified: Secondary | ICD-10-CM

## 2022-11-13 DIAGNOSIS — I25118 Atherosclerotic heart disease of native coronary artery with other forms of angina pectoris: Secondary | ICD-10-CM

## 2022-11-13 DIAGNOSIS — I10 Essential (primary) hypertension: Secondary | ICD-10-CM | POA: Diagnosis not present

## 2022-11-13 DIAGNOSIS — I712 Thoracic aortic aneurysm, without rupture, unspecified: Secondary | ICD-10-CM

## 2022-11-13 DIAGNOSIS — Z8673 Personal history of transient ischemic attack (TIA), and cerebral infarction without residual deficits: Secondary | ICD-10-CM

## 2022-11-13 MED ORDER — LOSARTAN POTASSIUM 25 MG PO TABS: 25.0000 mg | ORAL_TABLET | Freq: Every day | ORAL | 3 refills | Status: DC

## 2022-11-13 NOTE — Patient Instructions (Signed)
Medication Instructions:  Your physician has recommended you make the following change in your medication:   REDUCE Losartan to '25mg'$  daily and take in the evening   You may use up your '50mg'$  tablets by taking half tablet   *If you need a refill on your cardiac medications before your next appointment, please call your pharmacy*   Lab Work/Testing/Procedures: Your monitor showed mostly normal sinus rhythm which is great! No significant low heart rates.   Follow-Up: At [facility], you and your health needs are our priority.  As part of our continuing mission to provide you with exceptional heart care, we have created designated Provider Care Teams.  These Care Teams include your primary Cardiologist (physician) and Advanced Practice Providers (APPs -  Physician Assistants and Nurse Practitioners) who all work together to provide you with the care you need, when you need it.  We recommend signing up for the patient portal called "MyChart".  Sign up information is provided on this After Visit Summary.  MyChart is used to connect with patients for Virtual Visits (Telemedicine).  Patients are able to view lab/test results, encounter notes, upcoming appointments, etc.  Non-urgent messages can be sent to your provider as well.   To learn more about what you can do with MyChart, go to [email or web address].    Your next appointment:   4-6 month(s)  The format for your next appointment:   In Person  Provider:   Skeet Latch, MD or Laurann Montana, NP    Other Instructions  Tips to Measure your Blood Pressure Correctly  Here's what you can do to ensure a correct reading:  Don't drink a caffeinated beverage or smoke during the 30 minutes before the test.  Sit quietly for five minutes before the test begins.  During the measurement, sit in a chair with your feet on the floor and your arm supported so your elbow is at about heart level.  The inflatable part of the cuff should  completely cover at least 80% of your upper arm, and the cuff should be placed on bare skin, not over a shirt.  Don't talk during the measurement.   Blood pressure categories  Blood pressure category SYSTOLIC (upper number)  DIASTOLIC (lower number)  Normal Less than 120 mm Hg and Less than 80 mm Hg  Elevated 120-129 mm Hg and Less than 80 mm Hg  High blood pressure: Stage 1 hypertension 130-139 mm Hg or 80-89 mm Hg  High blood pressure: Stage 2 hypertension 140 mm Hg or higher or 90 mm Hg or higher  Hypertensive crisis (consult your doctor immediately) Higher than 180 mm Hg and/or Higher than 120 mm Hg  Source: American Heart Association and American Stroke Association. For more on getting your blood pressure under control, buy Controlling Your Blood Pressure, a Special Health Report from [facility].   Blood Pressure Log   Date   Time  Blood Pressure  Example: Nov 1 9 AM 124/78

## 2022-11-13 NOTE — Progress Notes (Signed)
Office Visit    Patient Name: [name] Date of Encounter: 11/13/2022  PCP:  Howell Rucks, [place]  Cardiologist:  Skeet Latch, MD  Advanced Practice Provider:  No care team member to display Electrophysiologist:  None   Chief Complaint    Chris Navarro is a 88 y.o. male presents today for follow-up after cardiac testing   Past Medical History    Past Medical History:  Diagnosis Date   Coronary artery calcification 09/19/2015   Essential hypertension 09/19/2015   Hyperlipidemia 02/11/2016   Lightheadedness 10/19/2022   Prostate cancer ([place])    Skin cancer melanoma   Stroke (cerebrum) ([place]) 09/19/2015   Stroke ([place]) mini stroke   Syncope 09/19/2015   Thoracic aortic aneurysm ([place]) 09/19/2015   Descending; 4.3 cm 08/2015 on CT scan   Past Surgical History:  Procedure Laterality Date   HERNIA REPAIR  2005   [place]    Allergies  Allergies  Allergen Reactions   Atorvastatin     transaminitis    History of Present Illness    Chris Navarro is a 88 y.o. male with a hx of prior CVA, bradycardia, moderate ascending aortic aneurysm, coronary calcification, moderate aortic regurgitation last seen 10/19/2022.  Initially seen November 2016 was taking good p.o. while on transcontinental flight the month prior.  Seen at [facility] and workup reportedly unremarkable thought to be due to intravascular volume depletion.  CT negative for PE though did show coronary calcification as well as ascending thoracic aortic aneurysm 4.3 cm.  Holter monitor revealed predominantly normal sinus rhythm with 34 minutes of bradycardia with heart rate in the upper 40s.  Also revealed 16 beat run of SVT which was asymptomatic  Exercise Cardiolite November 2016 with no perfusion defects and EF 48%.  Echo in [place] LVEF 55 to 60%, moderate LA enlargement, mild MR, moderate AI.  Mild ascending aortic aneurysm  4.3 cm.  Repeat echo 02/2017 LVEF 60 to 60%, mild to moderate AI, mild ascending aortic aneurysm unchanged.  Echo 03/2019 aneurysm increased to 4.6 cm.  Repeat echo 03/2020 ascending aortic aneurysm stable 4.6 cm.  He was started on carvedilol for blood pressure control but have lightheadedness and fatigue and was transition to nebivolol.  Last seen 10/19/2022.  Echocardiogram and ZIO ordered due to fatigue.Marland Kitchen  Bystolic reduced to 10 mg daily due to bradycardia.  Echocardiogram 10/19/2022 normal LVEF 55 to 60%, no RWMA, mild LVH, RV normal size and function, LA mildly dilated, mild to moderate MR, mild calcification of aortic valve with mild to moderate AI but no stenosis.  Mild dilation of ascending aorta 44 mm.  Preliminary monitor report with predominantly normal sinus rhythm average heart rate 62 bpm.  8 runs of SVT fastest 4 beats at 143 bpm with longest 7 beats 111 bpm.  Rare PVC/PAC.  Triggered episodes were associated with normal sinus rhythm.  He presents today for follow-up with his wife. Planning to spend the next 3 months in [place]. Reports no shortness of breath nor dyspnea on exertion. Reports no chest pain, pressure, or tightness. No edema, orthopnea, PND. Reports no palpitations.  Does report persistent fatigue.  Describes waking up feeling well in the morning but wearing out more easily than usual.  Brings blood pressure at home which is labile as low as 80s / 60s but as high as 150s / 80s.  EKGs/Labs/Other Studies Reviewed:   The  following studies were reviewed today:  Echo 10/19/22  1. Left ventricular ejection fraction, by estimation, is 55 to 60%. Left  ventricular ejection fraction by 3D volume is 53 %. The left ventricle has  normal function. The left ventricle has no regional wall motion  abnormalities. There is mild left  ventricular hypertrophy. Left ventricular diastolic function could not be  evaluated.   2. Right ventricular systolic function is normal. The right  ventricular  size is normal. There is normal pulmonary artery systolic pressure. The  estimated right ventricular systolic pressure is 03.4 mmHg.   3. Left atrial size was mildly dilated.   4. The mitral valve is grossly normal. Mild to moderate mitral valve  regurgitation. No evidence of mitral stenosis.   5. The aortic valve is grossly normal. There is mild calcification of the  aortic valve. Aortic valve regurgitation is mild to moderate. Aortic valve  sclerosis is present, with no evidence of aortic valve stenosis.   6. Aortic dilatation noted. There is mild dilatation of the ascending  aorta, measuring 44 mm.   7. The inferior vena cava is normal in size with <50% respiratory  variability, suggesting right atrial pressure of 8 mmHg.   EKG:  EKG is not ordered today.  Recent Labs: 10/23/2022: ALT 10; BUN 25; Creatinine, Ser 1.51; Potassium 4.6; Sodium 142  Recent Lipid Panel    Component Value Date/Time   CHOL 139 10/23/2022 0958   TRIG 86 10/23/2022 0958   HDL 63 10/23/2022 0958   CHOLHDL 3.0 07/22/2021 1003   CHOLHDL 2.3 02/26/2017 1410   VLDL 13 02/26/2017 1410   LDLCALC 60 10/23/2022 0958    Home Medications   Current Meds  Medication Sig   aspirin 81 MG tablet Take 81 mg by mouth daily.   Magnesium 500 MG TABS Take 500 mg by mouth daily.   nebivolol (BYSTOLIC) 10 MG tablet Take 1 tablet (10 mg total) by mouth daily.   rosuvastatin (CRESTOR) 20 MG tablet Take 1 tablet (20 mg total) by mouth daily.   tamsulosin (FLOMAX) 0.4 MG CAPS capsule Take 0.4 mg by mouth daily.   [DISCONTINUED] losartan (COZAAR) 50 MG tablet Take 1 tablet (50 mg total) by mouth daily.     Review of Systems      All other systems reviewed and are otherwise negative except as noted above.  Physical Exam    VS:  BP 104/60   Pulse 75   Ht 5' 8.5" (1.74 m)   Wt 184 lb (83.5 kg)   BMI 27.57 kg/m  , BMI Body mass index is 27.57 kg/m.  Wt Readings from Last 3 Encounters:  11/13/22 184 lb  (83.5 kg)  10/19/22 182 lb (82.6 kg)  04/13/22 180 lb 6.4 oz (81.8 kg)    GEN: Well nourished, well developed, in no acute distress. HEENT: normal. Neck: Supple, no JVD, carotid bruits, or masses. Cardiac: RRR, no murmurs, rubs, or gallops. No clubbing, cyanosis, edema.  Radials/PT 2+ and equal bilaterally.  Respiratory:  Respirations regular and unlabored, clear to auscultation bilaterally. GI: Soft, nontender, nondistended. MS: No deformity or atrophy. Skin: Warm and dry, no rash. Neuro:  Strength and sensation are intact. Psych: Normal affect.  Assessment & Plan    Hypertension- Episodes of hypotension at home as low as 80s/60s. Will reduce Losartan to '25mg'$  daily and have him move to evening dosing. Continue Bystolic '10mg'$  daily. Discussed to monitor BP at home at least 2 hours after medications and sitting for  5-10 minutes. MyChart message in 2 weeks to check in   Bradycardia - Improved since reduced dose of Bystolic. Monitor with no significant pause nor bradycardia.   History of CVA- Continue Aspirin, Rosuvastatin.   Thoracic aortic aneurysm-echo 10/2022 stable at 4.6 cm. Continue optimal BP control.  Coronary artery calcification/hyperlipidemia- Stable with no anginal symptoms. No indication for ischemic evaluation.   GDMT Aspirin, Nebivolol, Rosuvastatin.        Disposition: Follow up  in 4-6 months  with Skeet Latch, MD or APP.  Signed, Loel Dubonnet, NP 11/13/2022, 10:39 AM [place]

## 2022-11-15 ENCOUNTER — Encounter (HOSPITAL_BASED_OUTPATIENT_CLINIC_OR_DEPARTMENT_OTHER): Payer: Self-pay | Admitting: Cardiovascular Disease

## 2022-11-26 ENCOUNTER — Encounter (HOSPITAL_BASED_OUTPATIENT_CLINIC_OR_DEPARTMENT_OTHER): Payer: Self-pay

## 2022-12-07 NOTE — Telephone Encounter (Signed)
BP log as requested 

## 2023-03-30 ENCOUNTER — Encounter (HOSPITAL_BASED_OUTPATIENT_CLINIC_OR_DEPARTMENT_OTHER): Payer: Self-pay | Admitting: Cardiovascular Disease

## 2023-03-30 ENCOUNTER — Ambulatory Visit (INDEPENDENT_AMBULATORY_CARE_PROVIDER_SITE_OTHER): Payer: Medicare Other | Admitting: Cardiovascular Disease

## 2023-03-30 VITALS — BP 136/82 | HR 59 | Ht 67.0 in | Wt 180.4 lb

## 2023-03-30 DIAGNOSIS — I7121 Aneurysm of the ascending aorta, without rupture: Secondary | ICD-10-CM | POA: Diagnosis not present

## 2023-03-30 DIAGNOSIS — I10 Essential (primary) hypertension: Secondary | ICD-10-CM | POA: Diagnosis not present

## 2023-03-30 DIAGNOSIS — I2510 Atherosclerotic heart disease of native coronary artery without angina pectoris: Secondary | ICD-10-CM

## 2023-03-30 DIAGNOSIS — I63019 Cerebral infarction due to thrombosis of unspecified vertebral artery: Secondary | ICD-10-CM | POA: Diagnosis not present

## 2023-03-30 DIAGNOSIS — E785 Hyperlipidemia, unspecified: Secondary | ICD-10-CM

## 2023-03-30 DIAGNOSIS — I2584 Coronary atherosclerosis due to calcified coronary lesion: Secondary | ICD-10-CM

## 2023-03-30 DIAGNOSIS — R42 Dizziness and giddiness: Secondary | ICD-10-CM

## 2023-03-30 NOTE — Patient Instructions (Signed)
Medication Instructions:  Your physician recommends that you continue on your current medications as directed. Please refer to the Current Medication list given to you today.  *If you need a refill on your cardiac medications before your next appointment, please call your pharmacy*  Lab Work: NONE  Testing/Procedures: Your physician has requested that you have an echocardiogram. Echocardiography is a painless test that uses sound waves to create images of your heart. It provides your doctor with information about the size and shape of your heart and how well your heart's chambers and valves are working. This procedure takes approximately one hour. There are no restrictions for this procedure. Please do NOT wear cologne, perfume, aftershave, or lotions (deodorant is allowed). Please arrive 15 minutes prior to your appointment time.  IN [place]   Follow-Up: At [facility], you and your health needs are our priority.  As part of our continuing mission to provide you with exceptional heart care, we have created designated Provider Care Teams.  These Care Teams include your primary Cardiologist (physician) and Advanced Practice Providers (APPs -  Physician Assistants and Nurse Practitioners) who all work together to provide you with the care you need, when you need it.  We recommend signing up for the patient portal called "MyChart".  Sign up information is provided on this After Visit Summary.  MyChart is used to connect with patients for Virtual Visits (Telemedicine).  Patients are able to view lab/test results, encounter notes, upcoming appointments, etc.  Non-urgent messages can be sent to your provider as well.   To learn more about what you can do with MyChart, go to [email or web address].    Your next appointment:   7 month(s)  Provider:   Chilton Si, MD  AFTER ECHO

## 2023-03-30 NOTE — Progress Notes (Signed)
Cardiology Office Note   Date:  11/15/2022   ID:  Chris Navarro, DOB [birth year], MRN [id number]  PCP:  Eulis Canner, PA  Cardiologist:  Chilton Si, MD  Electrophysiologist:  None   Evaluation Performed:  Follow-Up Visit  Chief Complaint:  hypertension  History of Present Illness:    Chris Navarro is an 88 y.o. male with prior stroke, bradycardia, moderate ascending aortic aneurysm, coronary calcification and moderate aortic regurgitation who presents for follow up.  Chris Navarro was first seen in 09/2015.  He had an episode of syncope while on a transcontinental flight 08/2015.   He was [facility] at [facility] where his workup was reportedly unremarkable. The event was thought to be due to intravascular volume depletion. He had a CT scan that was negative for pulmonary embolism, but did show some coronary calcifications. It also showed an ascending thoracic aortic aneurysm of 4.3 cm. He was told to follow-up with cardiologist.  Chris Navarro also had a Holter monitor placed that revealed mostly sinus rhythm with 34 minutes of bradycardia with heart rates in the upper 40s. In addition it revealed a 16 beat run of supraventricular tachycardia.  He denied any symptoms while wearing the monitor and believed that he was up and getting dressed during the episode of bradycardia.   Chris Navarro was referred for an exercise Cardiolite 09/2015.  He exercised for 6 minutes and there were no perfusion defects. LVEF was 48%.  He subsequently had an echo done in [place] with LVEF 55-60%.  There was moderate LA enlargement, mild mitral regurgitation, and moderate aortic regurgitation.  He was noted to have a mild ascending aortic aneurysm measuring 4.3 cm.  Chris Navarro had a repeat echo 02/26/17 that revealed LVEF 60-65% with mild to moderate aortic regurgitation and a mild ascending aortic aneurysm that was unchanged in size.  However his last echo 03/2019 showed that his LVEF was labile but aneurysm has increased to 4.6  cm.  Chris Navarro's blood pressure was running high.  His aortic aneurysm was also increasing, so carvedilol was added.  He had a repeat echocardiogram 03/2020 that showed a ascending aortic aneurysm was still 4.6 cm.  Since adding carvedilol he started having lightheadedness and fatigue. Due to fatigue, we switched carvedilol to Nebivolol.  He followed up with our pharmacist 08/2021 and was feeling much better. His blood pressures were slightly above goal, so losartan was increased. He followed up virtually 08/2021 and his blood pressure was 130/74.  He had an echo 10/2022 which revealed normal systolic function. His ascending aorta is still 4.6cm.  At his appointment 10/2022 he noted neck pain and stiffness as well as lightheadedness  He was noted to be orthostatic and losartan and nebivolol were reduced.  He followed up with Gillian Shields, NP 11/2022 and noted labile BP ranging from 80s to 150s.  He became increasingly fatigued later in the day.  Losartan was reduced and he was instructed to start taking it in the evening.  Bradycardia resolved.   Chris Navarro describes a recurring issue of early morning lower back pain and weakness, which tends to improve as the day progresses. He associates these symptoms with the recent completion of a 21-day course of Ciprofloxacin, which he stopped taking the day before the visit. He remains on Flomax.  Chris Navarro recalls that the weakness was not as pronounced before starting the medication.  He also notes that his blood pressure readings have been stable, ranging between 125/60 and 130/80, and  denies any recent episodes of dizziness. He expresses concern about the morning back pain and questions whether it could be a side effect of Tamsulosin, which he read could cause backache.  Regarding his lifestyle, Chris Navarro mentions that he exercises daily, although he has stopped walking and playing golf as he used to. Instead, he now assists with yard work.   Past Medical History:   Diagnosis Date   Coronary artery calcification 09/19/2015   Essential hypertension 09/19/2015   Hyperlipidemia 02/11/2016   Lightheadedness 10/19/2022   Prostate cancer ([facility])    Skin cancer melanoma   Stroke (cerebrum) ([facility]) 09/19/2015   Stroke ([facility]) mini stroke   Syncope 09/19/2015   Thoracic aortic aneurysm ([facility]) 09/19/2015   Descending; 4.3 cm 08/2015 on CT scan    Past Surgical History:  Procedure Laterality Date   HERNIA REPAIR  2005   KIDNEY STONE SURGERY  1990   MELANOMA EXCISION  1994     Current Outpatient Medications  Medication Sig Dispense Refill   aspirin 81 MG tablet Take 81 mg by mouth daily.     Magnesium 500 MG TABS Take 500 mg by mouth daily.     nebivolol (BYSTOLIC) 10 MG tablet Take 1 tablet (10 mg total) by mouth daily. 90 tablet 3   rosuvastatin (CRESTOR) 20 MG tablet Take 1 tablet (20 mg total) by mouth daily. 90 tablet 3   tamsulosin (FLOMAX) 0.4 MG CAPS capsule Take 0.4 mg by mouth daily.     losartan (COZAAR) 25 MG tablet Take 1 tablet (25 mg total) by mouth at bedtime. 90 tablet 3   No current facility-administered medications for this visit.    Allergies:   Atorvastatin    Social History:  The patient  reports that he quit smoking about 57 years ago. His smoking use included cigarettes. He has never used smokeless tobacco. He reports current alcohol use of about 1.0 - 2.0 standard drink of alcohol per week.   Family History:  The patient's family history includes Angina in his brother; Cancer in his sister; Diabetes in his brother; Stroke in his father.    ROS:   Please see the history of present illness. + Back/neck pain + Lightheadedness + Fatigue All other systems are reviewed and negative.    PHYSICAL EXAM: VS:  BP (!) 110/58 (BP Location: Left Arm, Patient Position: Sitting, Cuff Size: Normal)   Pulse (!) 56   Ht 5' 8.5" (1.74 m)   Wt 182 lb (82.6 kg)   BMI 27.27 kg/m  , BMI Body mass index is 27.27 kg/m. GENERAL:  Well  appearing HEENT: Pupils equal round and reactive, fundi not visualized, oral mucosa unremarkable NECK:  No jugular venous distention, waveform within normal limits, carotid upstroke brisk and symmetric, no bruits, no thyromegaly LUNGS:  Clear to auscultation bilaterally HEART:  RRR.  PMI not displaced or sustained,S1 and S2 within normal limits, no S3, no S4, no clicks, no rubs, no murmurs ABD:  Flat, positive bowel sounds normal in frequency in pitch, no bruits, no rebound, no guarding, no midline pulsatile mass, no hepatomegaly, no splenomegaly EXT:  2 plus pulses throughout, trace ankle edema bilaterally, no cyanosis no clubbing SKIN:  No rashes no nodules NEURO:  Cranial nerves II through XII grossly intact, motor grossly intact throughout PSYCH:  Cognitively intact, oriented to person place and time   EKG:   EKG ordered today, 10/19/22.  The ekg ordered today demonstrates sinus bradycardia rate of 56bpm. 04/13/2022:  EKG was  not ordered. 07/11/2021: Sinus rhythm. Rate 60 bpm. 02/01/2020: Sinus rhythm.  Rate 68 bpm. 06/28/18: Sinus bradycardia.  Rate 53 bpm.   02/26/17: Sinus rhythm. Rate 62 bpm. 10/02/15: sinus rhythm rate 76 bpm.   Echo 07/11/2021:  1. Left ventricular ejection fraction, by estimation, is 55 to 60%. The  left ventricle has normal function. The left ventricle has no regional  wall motion abnormalities. The left ventricular internal cavity size was  mildly dilated. Left ventricular  diastolic parameters are consistent with Grade I diastolic dysfunction  (impaired relaxation). The average left ventricular global longitudinal  strain is -18.9 %. The global longitudinal strain is normal.   2. Right ventricular systolic function is normal. The right ventricular  size is mildly enlarged. There is normal pulmonary artery systolic  pressure.   3. The mitral valve is normal in structure. Mild mitral valve  regurgitation. No evidence of mitral stenosis.   4. The aortic valve is  normal in structure. Aortic valve regurgitation is  moderate. No aortic stenosis is present.   5. Pulmonic valve regurgitation is moderate.   6. There is mild dilatation of the ascending aorta, measuring 44 mm.  There is mild dilatation of the aortic root, measuring 42 mm.   7. The inferior vena cava is normal in size with greater than 50%  respiratory variability, suggesting right atrial pressure of 3 mmHg.   Comparison(s): 03/21/20 EF 55-60%. Mild AI. Prior measure of Ao 46 mm.   Echo 03/2020: 1. Left ventricular ejection fraction, by estimation, is 55 to 60%. The  left ventricle has normal function. The left ventricle has no regional  wall motion abnormalities. Left ventricular diastolic parameters are  consistent with Grade I diastolic  dysfunction (impaired relaxation).   2. Right ventricular systolic function is normal. The right ventricular  size is normal. There is normal pulmonary artery systolic pressure.   3. Left atrial size was mildly dilated.   4. The mitral valve is normal in structure. Mild mitral valve  regurgitation. No evidence of mitral stenosis.   5. The aortic valve is normal in structure. Aortic valve regurgitation is  mild. No aortic stenosis is present. Aortic regurgitation PHT measures 702  msec.   6. Aortic dilatation noted. Aneurysm of the ascending aorta, measuring 46  mm. There is moderate dilatation of the aortic root measuring 44 mm.   7. The inferior vena cava is normal in size with greater than 50%  respiratory variability, suggesting right atrial pressure of 3 mmHg.    Echo 03/28/19: IMPRESSIONS   1. The left ventricle has normal systolic function, with an ejection  fraction of 55-60%. The cavity size was mildly dilated. Left ventricular  diastolic Doppler parameters are consistent with impaired relaxation.   2. The right ventricle has normal systolic function. The cavity was  normal.   3. The mitral valve is abnormal. Mild thickening of the mitral  valve  leaflet.   4. The tricuspid valve is grossly normal.   5. The aortic valve is tricuspid. Mild thickening of the aortic valve.  Aortic valve regurgitation is mild by color flow Doppler. No stenosis of  the aortic valve.   6. Aneurysm of the ascending aorta, measuring 46 mm.   7. The interatrial septum is aneurysmal.   8. Normal LV systolic function; mild LVE; mild diastolic dysfunction;  ascending aortic aneurysm (4.6 cm); mild AI and MR.    Recent Labs: 10/23/2022: ALT 10; BUN 25; Creatinine, Ser 1.51; Potassium 4.6; Sodium 142  Lipid Panel    Component Value Date/Time   CHOL 139 10/23/2022 0958   TRIG 86 10/23/2022 0958   HDL 63 10/23/2022 0958   CHOLHDL 3.0 07/22/2021 1003   CHOLHDL 2.3 02/26/2017 1410   VLDL 13 02/26/2017 1410   LDLCALC 60 10/23/2022 0958      Wt Readings from Last 3 Encounters:  11/13/22 184 lb (83.5 kg)  10/19/22 182 lb (82.6 kg)  04/13/22 180 lb 6.4 oz (81.8 kg)      ASSESSMENT AND PLAN:  # Hypertension: - Patient reports blood pressure readings between 125-130/60-80, indicating improved control. - Continue current medications: nebivolol (Bystolic) and losartan. - Encourage regular blood pressure monitoring and follow up if any concerns arise.  # Hyperlipidemia: - Patient's cholesterol levels are well-controlled on rosuvastaitn. - Continue simvastatin and maintain a heart-healthy diet.  # Mild aortic aneurysm:  - Plan to repeat echocardiogram in December to monitor the aorta. - Follow up with the patient after the echo to ensure stability  # Coronary calcification: # CVA:  Asymptomatic.  Continue aspirin and rosuvastatin.   Current medicines are reviewed at length with the patient today.  The patient does not have concerns regarding medicines.  The following changes have been made: none  Labs/ tests ordered today include:   Orders Placed This Encounter  Procedures   Comprehensive metabolic panel   Lipid panel   LONG  TERM MONITOR (3-14 DAYS)   EKG 12-Lead    Disposition:  Follow up in 6 months after echo    Signed, Chilton Si, MD  11/15/2022 7:20 AM    [facility]

## 2023-07-06 ENCOUNTER — Telehealth: Payer: Self-pay | Admitting: Cardiovascular Disease

## 2023-07-06 NOTE — Telephone Encounter (Signed)
Pt c/o BP issue: STAT if pt c/o blurred vision, one-sided weakness or slurred speech  1. What are your last 5 BP readings? 95/61, 95/56, 89/58, 102/62, 122/64  2. Are you having any other symptoms (ex. Dizziness, headache, blurred vision, passed out)? No  3. What is your BP issue? Pt is having a low BP reading and the office is concerned about this. Please advise

## 2023-07-06 NOTE — Telephone Encounter (Signed)
Spoke with Print production planner at [facility]  PCP concerned about low blood pressure readings Was seen last week and blood pressure was low so stopped Losartan and he returned today for blood pressure check.  Did bring his machine 119/65 and office reading 110/65 Patient had been getting SBP readings below 100 at home so PCP decreased Bystolic to 1/2 tablet daily  PCP wanted patient seen prior to follow up in December  Scheduled patient appointment for 9/12 with Dr Duke Salvia Advised patient to monitor blood pressure at home and call if continues to get SBP readings below 100   Patient will call with updated readings on 9/10, he would prefer not to have to come to visit since it is 4 hours roundtrip   Will forward to Ronn Melena NP and Dr Duke Salvia for review

## 2023-07-06 NOTE — Telephone Encounter (Signed)
Reasonable to remain off Losartan and continue reduced dose Bystolic.   Did PCP do any lab work like BMP, TSH, CBC to look into cause of hypotension? If not yet done, would recommend.   Alver Sorrow, NP

## 2023-07-13 ENCOUNTER — Telehealth: Payer: Self-pay | Admitting: Cardiovascular Disease

## 2023-07-13 MED ORDER — NEBIVOLOL HCL 5 MG PO TABS: 5.0000 mg | ORAL_TABLET | Freq: Every day | ORAL | Status: DC

## 2023-07-13 NOTE — Telephone Encounter (Signed)
Returned call to patient,    Patient states his blood pressure is looking better.   8/28 111/64 62 8/29 99/57 63 8/30 110/63 60 8/31 93/62 68 9/1 113/68 66 9/3 115/69 64  Reviewed provider recommendations with patient he states he does not feel like he needs labs at this time. He would also like to cancel September appointment at this time. He will continue to monitor BP and call us if anything should change for lower or better readings

## 2023-07-13 NOTE — Telephone Encounter (Signed)
Pt states he would like to speak with Nurse Juliette Alcide about his BP checks. Please advise

## 2023-07-22 ENCOUNTER — Ambulatory Visit (HOSPITAL_BASED_OUTPATIENT_CLINIC_OR_DEPARTMENT_OTHER): Payer: Medicare Other | Admitting: Cardiovascular Disease

## 2023-08-31 ENCOUNTER — Telehealth: Payer: Self-pay | Admitting: Cardiovascular Disease

## 2023-08-31 MED ORDER — ROSUVASTATIN CALCIUM 20 MG PO TABS: 20.0000 mg | ORAL_TABLET | Freq: Every day | ORAL | 3 refills | Status: DC

## 2023-08-31 NOTE — Telephone Encounter (Signed)
*  STAT* If patient is at the pharmacy, call can be transferred to refill team.   1. Which medications need to be refilled? (please list name of each medication and dose if known)  rosuvastatin (CRESTOR) 20 MG tablet    2. Would you like to learn more about the convenience, safety, & potential cost savings by using the [facility]?      3. Are you open to using the [facility] (Type [facility].  ).   4. Which pharmacy/location (including street and city if local pharmacy) is medication to be sent to? [facility] [address] [place], [place] - [address]    5. Do they need a 30 day or 90 day supply? 90 day  Patient has 5 days left.

## 2023-10-14 ENCOUNTER — Ambulatory Visit (HOSPITAL_BASED_OUTPATIENT_CLINIC_OR_DEPARTMENT_OTHER): Payer: Medicare Other

## 2023-10-14 DIAGNOSIS — I7121 Aneurysm of the ascending aorta, without rupture: Secondary | ICD-10-CM

## 2023-10-14 DIAGNOSIS — I10 Essential (primary) hypertension: Secondary | ICD-10-CM | POA: Diagnosis not present

## 2023-10-14 LAB — ECHOCARDIOGRAM COMPLETE
Area-P 1/2: 2 cm2
MV M vel: 5.42 m/s
MV Peak grad: 117.5 mm[Hg]
P 1/2 time: 630 ms
Radius: 1 cm
S' Lateral: 3.28 cm

## 2023-10-15 ENCOUNTER — Ambulatory Visit (HOSPITAL_BASED_OUTPATIENT_CLINIC_OR_DEPARTMENT_OTHER): Payer: Medicare Other | Admitting: Cardiovascular Disease

## 2023-10-15 ENCOUNTER — Encounter (HOSPITAL_BASED_OUTPATIENT_CLINIC_OR_DEPARTMENT_OTHER): Payer: Self-pay | Admitting: Cardiovascular Disease

## 2023-10-15 VITALS — BP 118/70 | HR 68 | Ht 67.0 in | Wt 180.3 lb

## 2023-10-15 DIAGNOSIS — I7121 Aneurysm of the ascending aorta, without rupture: Secondary | ICD-10-CM

## 2023-10-15 DIAGNOSIS — I2510 Atherosclerotic heart disease of native coronary artery without angina pectoris: Secondary | ICD-10-CM | POA: Diagnosis not present

## 2023-10-15 DIAGNOSIS — I63019 Cerebral infarction due to thrombosis of unspecified vertebral artery: Secondary | ICD-10-CM

## 2023-10-15 DIAGNOSIS — B952 Enterococcus as the cause of diseases classified elsewhere: Secondary | ICD-10-CM

## 2023-10-15 DIAGNOSIS — E785 Hyperlipidemia, unspecified: Secondary | ICD-10-CM

## 2023-10-15 DIAGNOSIS — I10 Essential (primary) hypertension: Secondary | ICD-10-CM | POA: Diagnosis not present

## 2023-10-15 DIAGNOSIS — R7881 Bacteremia: Secondary | ICD-10-CM

## 2023-10-15 HISTORY — DX: Enterococcus as the cause of diseases classified elsewhere: R78.81

## 2023-10-15 MED ORDER — AMOXICILLIN-POT CLAVULANATE 500-125 MG PO TABS: 1.0000 | ORAL_TABLET | Freq: Two times a day (BID) | ORAL | 0 refills | Status: DC

## 2023-10-15 NOTE — Patient Instructions (Signed)
Medication Instructions:  START AUGMENTIN 50-125 MG TWICE A DAY, DO NOT TAKE THE CIPRO   *If you need a refill on your cardiac medications before your next appointment, please call your pharmacy*  Lab Work: NONE  Testing/Procedures: Non-Cardiac CT scanning, (CAT scanning), is a noninvasive, special x-ray that produces cross-sectional images of the body using x-rays and a computer. CT scans help physicians diagnose and treat medical conditions. For some CT exams, a contrast material is used to enhance visibility in the area of the body being studied. CT scans provide greater clarity and reveal more details than regular x-ray exams. CHEST/AORTA TO BE DONE IN 6 MONTHS   Follow-Up: At [facility], you and your health needs are our priority.  As part of our continuing mission to provide you with exceptional heart care, we have created designated Provider Care Teams.  These Care Teams include your primary Cardiologist (physician) and Advanced Practice Providers (APPs -  Physician Assistants and Nurse Practitioners) who all work together to provide you with the care you need, when you need it.  We recommend signing up for the patient portal called "MyChart".  Sign up information is provided on this After Visit Summary.  MyChart is used to connect with patients for Virtual Visits (Telemedicine).  Patients are able to view lab/test results, encounter notes, upcoming appointments, etc.  Non-urgent messages can be sent to your provider as well.   To learn more about what you can do with MyChart, go to [email or web address].    Your next appointment:   6 month(s)  Provider:   Chilton Si, MD or Gillian Shields, NP  ABOUT A WEEK AFTER CT

## 2023-10-15 NOTE — Progress Notes (Signed)
Cardiology Office Note:  .   Date:  10/15/2023  ID:  Chris Navarro, DOB October 24, 1935, MRN [id number] PCP: Delynn Flavin, DO  [facility] Providers Cardiologist:  Chilton Si, MD    History of Present Illness: .   Chris Navarro is a 88 y.o. male with prior stroke, bradycardia, moderate ascending aortic aneurysm, coronary calcification and moderate aortic regurgitation who presents for follow up.  Chris Navarro was first seen in 09/2015.  He had an episode of syncope while on a transcontinental flight 08/2015.   He was seen at [facility] where his workup was reportedly unremarkable. The event was thought to be due to intravascular volume depletion. He had a CT scan that was negative for pulmonary embolism, but did show some coronary calcifications. It also showed an ascending thoracic aortic aneurysm of 4.3 cm. He was told to follow-up with cardiologist.  Chris Navarro also had a Holter monitor placed that revealed mostly sinus rhythm with 34 minutes of bradycardia with heart rates in the upper 40s. In addition it revealed a 16 beat run of supraventricular tachycardia.  He denied any symptoms while wearing the monitor and believed that he was up and getting dressed during the episode of bradycardia.    Chris Navarro was referred for an exercise Cardiolite 09/2015.  He exercised for 6 minutes and there were no perfusion defects. LVEF was 48%.  He subsequently had an echo done in [place] with LVEF 55-60%.  There was moderate LA enlargement, mild mitral regurgitation, and moderate aortic regurgitation.  He was noted to have a mild ascending aortic aneurysm measuring 4.3 cm.  Chris Navarro had a repeat echo 02/26/17 that revealed LVEF 60-65% with mild to moderate aortic regurgitation and a mild ascending aortic aneurysm that was unchanged in size.  However his last echo 03/2019 showed that his LVEF was labile but aneurysm has increased to 4.6 cm.   Chris Navarro's blood pressure was running high.  His aortic aneurysm was  also increasing, so carvedilol was added.  He had a repeat echocardiogram 03/2020 that showed a ascending aortic aneurysm was still 4.6 cm.  Since adding carvedilol he started having lightheadedness and fatigue. Due to fatigue, we switched carvedilol to Nebivolol.  He followed up with our pharmacist 08/2021 and was feeling much better. His blood pressures were slightly above goal, so losartan was increased. He followed up virtually 08/2021 and his blood pressure was 130/74.  He had an echo 10/2022 which revealed normal systolic function. His ascending aorta is still 4.6cm.   At his appointment 10/2022 he noted neck pain and stiffness as well as lightheadedness  He was noted to be orthostatic and losartan and nebivolol were reduced.  He followed up with Chris Shields, NP 11/2022 and noted labile BP ranging from 80s to 150s.  He became increasingly fatigued later in the day.  Losartan was reduced and he was instructed to start taking it in the evening.  Bradycardia resolved.   At his visit 03/2023 he struggled with recurring lower back pain and weakness.  This was in the setting of a urinary tract infection that had recently been treating.  He was otherwise well.  He had an echo 10/14/2023 that revealed LVEF 55-60% with mild asymmetric hypertrophy of the inferior segment.  Diastolic function was indeterminate.  He had mild to moderate mitral regurgitation and mild to moderate aortic regurgitation.  Ascending aorta was 4.6 cm and the aortic root was 4.2 cm.   Chris Navarro presents with  a recent diagnosis of bacteremia, suspected to be secondary to a urinary tract infection. T  He was started on Ciprofloxacin by an outside provider.  He reported increased urinary frequency, particularly at night, which prompted the initial workup.  he was also hypotensive. He denies any symptoms suggestive of systemic infection such as fever or malaise.  [name] also reports a new onset of lower back pain, particularly severe in the  mornings and after showering, which has limited his physical activity. The pain reportedly improves as the day progresses. The patient denies any chest pain or leg swelling.  His blood pressure has been stable, although he reports it was low at the time of the bacteremia diagnosis. The patient has been adherent to his current regimen of aspirin, Bystolic, and rosuvastatin. He denies any changes in medication or new medications.     ROS:  As per HPI  Studies Reviewed: Marland Kitchen   EKG Interpretation Date/Time:  Friday October 15 2023 09:20:47 EST Ventricular Rate:  72 PR Interval:  168 QRS Duration:  92 QT Interval:  418 QTC Calculation: 457 R Axis:   10  Text Interpretation: Sinus rhythm with Premature supraventricular complexes Minimal voltage criteria for LVH, may be normal variant ( R in aVL ) Possible Inferior infarct , age undetermined No previous ECGs available Confirmed by Chilton Si ([id number]) on 10/15/2023 9:42:49 AM   Echo 10/14/23:    1. Left ventricular ejection fraction, by estimation, is 55 to 60%. The  left ventricle has normal function. The left ventricle has no regional  wall motion abnormalities. There is mild asymmetric left ventricular  hypertrophy of the inferior segment. Left   ventricular diastolic parameters are indeterminate.   2. Right ventricular systolic function is normal. The right ventricular  size is normal. Tricuspid regurgitation signal is inadequate for assessing  PA pressure.   3. Left atrial size was mildly dilated.   4. The mitral valve is degenerative. Mild to moderate mitral valve  regurgitation. No evidence of mitral stenosis.   5. The aortic valve is tricuspid. There is mild calcification of the  aortic valve. Aortic valve regurgitation is mild to moderate. No aortic  stenosis is present.   6. Aortic dilatation noted. Aneurysm of the ascending aorta, measuring 46  mm. There is mild dilatation of the aortic root, measuring 42 mm.   7. The  inferior vena cava is normal in size with <50% respiratory  variability, suggesting right atrial pressure of 8 mmHg.    Risk Assessment/Calculations:             Physical Exam:   VS:  BP 118/70   Pulse 68   Ht 5\' 7"  (1.702 m)   Wt 180 lb 4.8 oz (81.8 kg)   SpO2 99%   BMI 28.24 kg/m  , BMI Body mass index is 28.24 kg/m. GENERAL:  Well appearing HEENT: Pupils equal round and reactive, fundi not visualized, oral mucosa unremarkable NECK:  No jugular venous distention, waveform within normal limits, carotid upstroke brisk and symmetric, no bruits, no thyromegaly LUNGS:  Clear to auscultation bilaterally HEART:  RRR.  PMI not displaced or sustained,S1 and S2 within normal limits, no S3, no S4, no clicks, no rubs, no murmurs ABD:  Flat, positive bowel sounds normal in frequency in pitch, no bruits, no rebound, no guarding, no midline pulsatile mass, no hepatomegaly, no splenomegaly EXT:  2 plus pulses throughout, no edema, no cyanosis no clubbing SKIN:  No rashes no nodules NEURO:  Cranial nerves  II through XII grossly intact, motor grossly intact throughout PSYCH:  Cognitively intact, oriented to person place and time   ASSESSMENT AND PLAN: .    # Recurrent Urinary Tract Infections Recent bacteremia likely secondary to urinary tract infection. Currently on Ciprofloxacin, but experiencing back pain potentially related to the medication.  Additionally, fluoroquinolones are contraindicated in aortic aneurysm due to increased risk of rupture. -Urine culture grew Enterococcus faecalis.  Given his renal dysfunction will avoid Bactrim.  It is sensitive to penicillin.  Will prescribe Augmentin 500/125mg  q12h x11 days to complete his 21 day course.  # Moderate ascending Aortic Aneurysm Slight increase in size from 4.4cm to 4.6cm on recent echocardiogram. Fluoroquinolones contraindicated due to potential for aneurysm expansion. -Plan for CT-A in 6 months for more accurate imaging of the  aneurysm.  # Hypertension Well-controlled with Bystolic. -Continue Bystolic.  # Hyperlipidemia On Rosuvastatin. Recent cholesterol check not available. -Obtain recent cholesterol and metabolic panel from primary care provider. -Continue Rosuvastatin.  General Health Maintenance -Continue Aspirin. -Follow-up after CT scan results are available.         Dispo: f/u in 6 months after chest CT  Signed, Chilton Si, MD

## 2023-10-18 ENCOUNTER — Other Ambulatory Visit (HOSPITAL_BASED_OUTPATIENT_CLINIC_OR_DEPARTMENT_OTHER): Payer: Medicare Other

## 2023-11-30 ENCOUNTER — Other Ambulatory Visit (HOSPITAL_BASED_OUTPATIENT_CLINIC_OR_DEPARTMENT_OTHER): Payer: Self-pay | Admitting: Cardiovascular Disease

## 2024-04-14 ENCOUNTER — Ambulatory Visit (HOSPITAL_BASED_OUTPATIENT_CLINIC_OR_DEPARTMENT_OTHER)
Admission: RE | Admit: 2024-04-14 | Discharge: 2024-04-14 | Disposition: A | Discharge status: Home / Self Care | Payer: Medicare Other | Source: Ambulatory Visit | Attending: Cardiovascular Disease | Admitting: Cardiovascular Disease

## 2024-04-14 DIAGNOSIS — I7121 Aneurysm of the ascending aorta, without rupture: Secondary | ICD-10-CM

## 2024-04-14 LAB — POCT I-STAT CREATININE: Creatinine, Ser: 1.6 mg/dL — ABNORMAL HIGH (ref 0.61–1.24)

## 2024-04-14 MED ORDER — IOHEXOL 350 MG/ML SOLN
75.0000 mL | Freq: Once | INTRAVENOUS | Status: AC | PRN
  Administered 2024-04-14: 75 mL via INTRAVENOUS

## 2024-04-18 ENCOUNTER — Ambulatory Visit: Payer: Self-pay | Admitting: Cardiovascular Disease

## 2024-07-18 ENCOUNTER — Ambulatory Visit (HOSPITAL_BASED_OUTPATIENT_CLINIC_OR_DEPARTMENT_OTHER): Admitting: Family

## 2024-09-06 ENCOUNTER — Telehealth: Payer: Self-pay | Admitting: Cardiovascular Disease

## 2024-09-06 MED ORDER — ROSUVASTATIN CALCIUM 20 MG PO TABS: 20.0000 mg | ORAL_TABLET | Freq: Every day | ORAL | 0 refills | Status: DC

## 2024-09-06 NOTE — Telephone Encounter (Signed)
*  STAT* If patient is at the pharmacy, call can be transferred to refill team.   1. Which medications need to be refilled? (please list name of each medication and dose if known) rosuvastatin  (CRESTOR ) 20 MG tablet    2. Would you like to learn more about the convenience, safety, & potential cost savings by using the [facility]?    3. Are you open to using the [facility] (Type [facility]. ).   4. Which pharmacy/location (including street and city if local pharmacy) is medication to be sent to?  [facility] [address] [place], [place] - [address]     5. Do they need a 30 day or 90 day supply? 90 day

## 2024-10-26 ENCOUNTER — Encounter (HOSPITAL_BASED_OUTPATIENT_CLINIC_OR_DEPARTMENT_OTHER): Payer: Self-pay | Admitting: Cardiovascular Disease

## 2024-10-26 ENCOUNTER — Ambulatory Visit (INDEPENDENT_AMBULATORY_CARE_PROVIDER_SITE_OTHER): Admitting: Cardiovascular Disease

## 2024-10-26 VITALS — BP 98/56 | HR 76 | Ht 67.5 in | Wt 179.6 lb

## 2024-10-26 DIAGNOSIS — I10 Essential (primary) hypertension: Secondary | ICD-10-CM

## 2024-10-26 DIAGNOSIS — I7121 Aneurysm of the ascending aorta, without rupture: Secondary | ICD-10-CM

## 2024-10-26 MED ORDER — NEBIVOLOL HCL 2.5 MG PO TABS: 2.5000 mg | ORAL_TABLET | Freq: Every day | ORAL | 3 refills | Status: AC

## 2024-10-26 NOTE — Patient Instructions (Signed)
 Medication Instructions:  DECREASE YOUR NEBIVOLOL  TO 2.5 MG DAILY   *If you need a refill on your cardiac medications before your next appointment, please call your pharmacy*  Lab Work: NONE   Testing/Procedures: .Your physician has requested that you have an echocardiogram. Echocardiography is a painless test that uses sound waves to create images of your heart. It provides your doctor with information about the size and shape of your heart and how well your hearts chambers and valves are working. This procedure takes approximately one hour. There are no restrictions for this procedure. Please do NOT wear cologne, perfume, aftershave, or lotions (deodorant is allowed). Please arrive 15 minutes prior to your appointment time.  Please note: We ask at that you not bring children with you during ultrasound (echo/ vascular) testing. Due to room size and safety concerns, children are not allowed in the ultrasound rooms during exams. Our front office staff cannot provide observation of children in our lobby area while testing is being conducted. An adult accompanying a patient to their appointment will only be allowed in the ultrasound room at the discretion of the ultrasound technician under special circumstances. We apologize for any inconvenience.  Follow-Up: At [facility], you and your health needs are our priority.  As part of our continuing mission to provide you with exceptional heart care, our providers are all part of one team.  This team includes your primary Cardiologist (physician) and Advanced Practice Providers or APPs (Physician Assistants and Nurse Practitioners) who all work together to provide you with the care you need, when you need it.  Your next appointment:   12 month(s)  Provider:   Annabella Scarce, MD, Rosaline Bane, NP, or Reche Finder, NP    We recommend signing up for the patient portal called MyChart.  Sign up information is provided on this After  Visit Summary.  MyChart is used to connect with patients for Virtual Visits (Telemedicine).  Patients are able to view lab/test results, encounter notes, upcoming appointments, etc.  Non-urgent messages can be sent to your provider as well.   To learn more about what you can do with MyChart, go to [email or web address].   Other Instructions MONITOR YOUR BLOOD PRESSURE AND CALL IF IT STARTS GOING ABOVE 130

## 2024-10-26 NOTE — Progress Notes (Signed)
 Cardiology Office Note:  .   Date:  10/26/2024  ID:  Aida Bream, DOB [birth year], MRN [id number] PCP: Hyacinth Nancyann POUR, DO  [facility] Providers Cardiologist:  Annabella Scarce, MD    History of Present Illness: .    Chris Navarro is a 89 y.o. male with prior stroke, bradycardia, moderate ascending aortic aneurysm, coronary calcification and moderate aortic regurgitation who presents for follow up.  Chris Navarro was first seen in 09/2015.  He had an episode of syncope while on a transcontinental flight 08/2015.   He was seen at [facility] where his workup was reportedly unremarkable. The event was thought to be due to intravascular volume depletion. He had a CT scan that was negative for pulmonary embolism, but did show some coronary calcifications. It also showed an ascending thoracic aortic aneurysm of 4.3 cm. He was told to follow-up with cardiologist.  Chris Navarro also had a Holter monitor placed that revealed mostly sinus rhythm with 34 minutes of bradycardia with heart rates in the upper 40s. In addition it revealed a 16 beat run of supraventricular tachycardia.  He denied any symptoms while wearing the monitor and believed that he was up and getting dressed during the episode of bradycardia.    Chris Navarro was referred for an exercise Cardiolite  09/2015.  He exercised for 6 minutes and there were no perfusion defects. LVEF was 48%.  He subsequently had an echo done in [place]  with LVEF 55-60%.  There was moderate LA enlargement, mild mitral regurgitation, and moderate aortic regurgitation.  He was noted to have a mild ascending aortic aneurysm measuring 4.3 cm.  Chris Navarro had a repeat echo 02/26/17 that revealed LVEF 60-65% with mild to moderate aortic regurgitation and a mild ascending aortic aneurysm that was unchanged in size.  However his last echo 03/2019 showed that his LVEF was labile but aneurysm has increased to 4.6 cm.   Chris Navarro's blood pressure was running high.  His aortic aneurysm  was also increasing, so carvedilol  was added.  He had a repeat echocardiogram 03/2020 that showed a ascending aortic aneurysm was still 4.6 cm.  Since adding carvedilol  he started having lightheadedness and fatigue. Due to fatigue, we switched carvedilol  to Nebivolol .  He followed up with our pharmacist 08/2021 and was feeling much better. His blood pressures were slightly above goal, so losartan  was increased. He followed up virtually 08/2021 and his blood pressure was 130/74.  He had an echo 10/2022 which revealed normal systolic function. His ascending aorta is still 4.6cm.   At his appointment 10/2022 he noted neck pain and stiffness as well as lightheadedness  He was noted to be orthostatic and losartan  and nebivolol  were reduced.  He followed up with Reche Finder, NP 11/2022 and noted labile BP ranging from 80s to 150s.  He became increasingly fatigued later in the day.  Losartan  was reduced and he was instructed to start taking it in the evening.  Bradycardia resolved.    At his visit 03/2023 he struggled with recurring lower back pain and weakness.  This was in the setting of a urinary tract infection that had recently been treating.  He was otherwise well.  He had an echo 10/14/2023 that revealed LVEF 55-60% with mild asymmetric hypertrophy of the inferior segment.  Diastolic function was indeterminate.  He had mild to moderate mitral regurgitation and mild to moderate aortic regurgitation.  Ascending aorta was 4.6 cm and the aortic root was 4.2 cm.   At his  visit 10/2023 he struggled with recurrent UTIs and bacteremia. CT-A 04/2024 revealed his aorta was unchanged at 4.6 cm.  Discussed the use of AI scribe software for clinical note transcription with the patient, who gave verbal consent to proceed.  History of Present Illness Chris Navarro experiences lightheadedness at different times, which he describes as a fairly new symptom. No dizziness accompanies these episodes. He has not been regularly  checking his blood pressure at home.  He is currently taking nebivolol  at a dose of 5 mg, which he halves, taking 2.5 mg daily. No swelling in his legs or feet. His appetite remains good, and his weight is stable within five pounds.  He engages in physical exercise and walks with Jenkins, typically in the evenings, and feels okay during these activities. No issues with breathing during exercise.  His CT scan of the aorta was performed earlier this year. His cholesterol levels were checked in September, with an LDL of 62, and he continues on rosuvastatin . He does not take baby aspirin regularly, only occasionally for headaches.   ROS:  As per HPI  Studies Reviewed: SABRA   EKG Interpretation Date/Time:  Thursday October 26 2024 08:28:18 EST Ventricular Rate:  76 PR Interval:  168 QRS Duration:  84 QT Interval:  402 QTC Calculation: 452 R Axis:   15  Text Interpretation: Normal sinus rhythm Normal ECG When compared with ECG of 15-Oct-2023 09:20, Premature supraventricular complexes are no longer Present Borderline criteria for Inferior infarct are no longer Present Confirmed by Raford Riggs ([id number]) on 10/26/2024 8:39:22 AM   Echo 10/2023:    1. Left ventricular ejection fraction, by estimation, is 55 to 60%. The  left ventricle has normal function. The left ventricle has no regional  wall motion abnormalities. There is mild asymmetric left ventricular  hypertrophy of the inferior segment. Left   ventricular diastolic parameters are indeterminate.   2. Right ventricular systolic function is normal. The right ventricular  size is normal. Tricuspid regurgitation signal is inadequate for assessing  PA pressure.   3. Left atrial size was mildly dilated.   4. The mitral valve is degenerative. Mild to moderate mitral valve  regurgitation. No evidence of mitral stenosis.   5. The aortic valve is tricuspid. There is mild calcification of the  aortic valve. Aortic valve regurgitation is mild  to moderate. No aortic  stenosis is present.   6. Aortic dilatation noted. Aneurysm of the ascending aorta, measuring 46  mm. There is mild dilatation of the aortic root, measuring 42 mm.   7. The inferior vena cava is normal in size with <50% respiratory  variability, suggesting right atrial pressure of 8 mmHg.   CT-A 04/14/24: IMPRESSION:   Dilated ascending thoracic aorta measuring 4.6 cm.   Risk Assessment/Calculations:             Physical Exam:   VS:  BP (!) 98/56 (BP Location: Left Arm)   Pulse 76   Ht 5' 7.5 (1.715 m)   Wt 179 lb 9.6 oz (81.5 kg)   SpO2 95%   BMI 27.71 kg/m  , BMI Body mass index is 27.71 kg/m. GENERAL:  Well appearing HEENT: Pupils equal round and reactive, fundi not visualized, oral mucosa unremarkable NECK:  No jugular venous distention, waveform within normal limits, carotid upstroke brisk and symmetric, no bruits, no thyromegaly LUNGS:  Clear to auscultation bilaterally HEART:  RRR.  PMI not displaced or sustained,S1 and S2 within normal limits, no S3, no S4, no clicks,  no rubs, no murmurs ABD:  Flat, positive bowel sounds normal in frequency in pitch, no bruits, no rebound, no guarding, no midline pulsatile mass, no hepatomegaly, no splenomegaly EXT:  2 plus pulses throughout, no edema, no cyanosis no clubbing SKIN:  No rashes no nodules NEURO:  Cranial nerves II through XII grossly intact, motor grossly intact throughout PSYCH:  Cognitively intact, oriented to person place and time   ASSESSMENT AND PLAN: .    Assessment & Plan # Primary hypertension # Symptomatic hypotension:  Blood pressure low with occasional lightheadedness. Current nebivolol  dose may be too high. - Reduced nebivolol  to 2.5 mg daily. - Monitor blood pressure at home, target <130/80 mmHg. - Contact provider if BP >130/80 mmHg.  # Aortic atherosclerosis: # Hyperlipidemia:  Well-controlled on rosuvastatin .  Continue aspirin.   # Ascending aorta aneurysm:  Stable at  4.6 cm on echo and CT-A.  Repeat echo in 1 year.  Discuss whether to continue annual surveillance and whether he would pursue surgery if it were to enlarge.   # Mild-moderate AR:  Stable on echo.  He is euvolemic.  Echo in 1 year.     Dispo: f/u 1 year  Signed, Annabella Scarce, MD

## 2024-12-05 ENCOUNTER — Other Ambulatory Visit: Payer: Self-pay

## 2024-12-06 ENCOUNTER — Telehealth: Payer: Self-pay | Admitting: Cardiovascular Disease

## 2024-12-06 NOTE — Telephone Encounter (Signed)
" °*  STAT* If patient is at the pharmacy, call can be transferred to refill team.   1. Which medications need to be refilled? (please list name of each medication and dose if known)  rosuvastatin  (CRESTOR ) 20 MG tablet [[id number]]   NEW PHARMACY - PT IS IN [place]    4. Which pharmacy/location (including street and city if local pharmacy) is medication to be sent to? [facility] [address] [place], [place] - [address] Phone: [phone]  Fax: [phone]        5. Do they need a 30 day or 90 day supply? 90   "

## 2024-12-07 MED ORDER — ROSUVASTATIN CALCIUM 20 MG PO TABS: 20.0000 mg | ORAL_TABLET | Freq: Every day | ORAL | 3 refills | Status: AC

## 2024-12-07 NOTE — Telephone Encounter (Signed)
 Spoke with patient and his wife Arlean. Per last office visit with Dr. Raford in December, patient to continue rosuvastatin .  Refill sent to [facility] pharmacy in [place], [place] per patient's request.  Patient had Lipid panel completed 07/12/2024 (found in [facility]). Will follow-up with Dr. Raford December 2026.

## 2024-12-07 NOTE — Telephone Encounter (Signed)
 Refill sent in during phone encounter.

## 2024-12-07 NOTE — Telephone Encounter (Signed)
 Pts wife calling to f.u on refill. She is asking if it is discontinued or not. Please advise.   Requesting c/b at [phone]

## 2024-12-11 ENCOUNTER — Telehealth: Payer: Self-pay | Admitting: Cardiovascular Disease

## 2024-12-11 NOTE — Telephone Encounter (Signed)
 Left the pt a message to call the office back, if further assistance is still needed.   Pts medications are not contraindicated with him taking a 9 hour flight.

## 2024-12-12 NOTE — Telephone Encounter (Signed)
 Patient is requesting a call  back to this number  [phone]

## 2024-12-12 NOTE — Telephone Encounter (Signed)
Left the pt a message to call the office back. 

## 2024-12-12 NOTE — Telephone Encounter (Signed)
 Pt is calling to ask if there is any interaction with the cardiac medications he is taking and going on a 9 hour flight.   He states will the meds react to the 9 hour flight.   Informed the pt that there is no contraindication with the meds he is taking, while being on a 9 hour flight.   Did advise him to make sure he gets all his meds refilled for the trip, and take those on his carry on bag vs checking them in.  That way he can ensure his prescriptions want get lost.   Did advise him to wear compression stockings during the flight, stay plenty hydrated with water, and periodically stretch and move around the cabin, when the pilot deems this safe to.  Pt aware this will help promote good circulation during the long flight.   Pt verbalized understanding and agrees with this plan.  Pt was more than gracious for all the assistance provided.  He is aware I will share this information with Dr. Raford and nurse, in the case they have further recommendations to provide him for his travels.

## 2024-12-12 NOTE — Telephone Encounter (Signed)
"  Pt returned call, please advise.   "

## 2025-03-20 ENCOUNTER — Other Ambulatory Visit (HOSPITAL_BASED_OUTPATIENT_CLINIC_OR_DEPARTMENT_OTHER)
